# Patient Record
Sex: Female | Born: 1970 | Race: Black or African American | Hispanic: No | Marital: Single | Smoking: Current every day smoker
Health system: Southern US, Community
[De-identification: ages and names within clinical notes are randomized; demographics above are authoritative.]

## PROBLEM LIST (undated history)

## (undated) DIAGNOSIS — J449 Chronic obstructive pulmonary disease, unspecified: Secondary | ICD-10-CM

## (undated) DIAGNOSIS — I509 Heart failure, unspecified: Secondary | ICD-10-CM

## (undated) DIAGNOSIS — J45909 Unspecified asthma, uncomplicated: Secondary | ICD-10-CM

## (undated) DIAGNOSIS — M1990 Unspecified osteoarthritis, unspecified site: Secondary | ICD-10-CM

## (undated) DIAGNOSIS — K859 Acute pancreatitis without necrosis or infection, unspecified: Secondary | ICD-10-CM

## (undated) DIAGNOSIS — I10 Essential (primary) hypertension: Secondary | ICD-10-CM

## (undated) DIAGNOSIS — K5792 Diverticulitis of intestine, part unspecified, without perforation or abscess without bleeding: Secondary | ICD-10-CM

## (undated) HISTORY — PX: CARPAL TUNNEL RELEASE: SHX101

---

## 2020-08-22 ENCOUNTER — Other Ambulatory Visit: Payer: Self-pay

## 2020-08-22 ENCOUNTER — Emergency Department (HOSPITAL_COMMUNITY)
Admission: EM | Admit: 2020-08-22 | Discharge: 2020-08-22 | Disposition: A | Discharge status: Home / Self Care | Payer: 59 | Attending: Emergency Medicine | Admitting: Emergency Medicine

## 2020-08-22 ENCOUNTER — Encounter (HOSPITAL_COMMUNITY): Payer: Self-pay

## 2020-08-22 DIAGNOSIS — J449 Chronic obstructive pulmonary disease, unspecified: Secondary | ICD-10-CM | POA: Insufficient documentation

## 2020-08-22 DIAGNOSIS — L02411 Cutaneous abscess of right axilla: Secondary | ICD-10-CM | POA: Diagnosis not present

## 2020-08-22 DIAGNOSIS — J45909 Unspecified asthma, uncomplicated: Secondary | ICD-10-CM | POA: Diagnosis not present

## 2020-08-22 DIAGNOSIS — I509 Heart failure, unspecified: Secondary | ICD-10-CM | POA: Diagnosis not present

## 2020-08-22 DIAGNOSIS — I110 Hypertensive heart disease with heart failure: Secondary | ICD-10-CM | POA: Insufficient documentation

## 2020-08-22 DIAGNOSIS — R2231 Localized swelling, mass and lump, right upper limb: Secondary | ICD-10-CM | POA: Diagnosis present

## 2020-08-22 HISTORY — DX: Essential (primary) hypertension: I10

## 2020-08-22 HISTORY — DX: Heart failure, unspecified: I50.9

## 2020-08-22 HISTORY — DX: Chronic obstructive pulmonary disease, unspecified: J44.9

## 2020-08-22 HISTORY — DX: Acute pancreatitis without necrosis or infection, unspecified: K85.90

## 2020-08-22 HISTORY — DX: Diverticulitis of intestine, part unspecified, without perforation or abscess without bleeding: K57.92

## 2020-08-22 HISTORY — DX: Unspecified osteoarthritis, unspecified site: M19.90

## 2020-08-22 HISTORY — DX: Unspecified asthma, uncomplicated: J45.909

## 2020-08-22 MED ORDER — DOXYCYCLINE HYCLATE 100 MG PO CAPS: 100.0000 mg | ORAL_CAPSULE | Freq: Two times a day (BID) | ORAL | 0 refills | Status: DC

## 2020-08-22 NOTE — Discharge Instructions (Signed)
Please read and follow all provided instructions.  Your diagnoses today include:  1. Abscess of axilla, right     Tests performed today include:  Vital signs. See below for your results today.   Medications prescribed:   Doxycycline - antibiotic  You have been prescribed an antibiotic medicine: take the entire course of medicine even if you are feeling better. Stopping early can cause the antibiotic not to work.  Take any prescribed medications only as directed.   Home care instructions:   Follow any educational materials contained in this packet  Follow-up instructions: Return to the Emergency Department in 48 hours for a recheck if your symptoms are not significantly improved.  Please follow-up with your primary care provider in the next 1 week for further evaluation of your symptoms.   Return instructions:  Return to the Emergency Department if you have:  Fever  Worsening symptoms  Worsening pain  Worsening swelling  Redness of the skin that moves away from the affected area, especially if it streaks away from the affected area   Any other emergent concerns  Your vital signs today were: BP 140/86 (BP Location: Left Arm)   Pulse 75   Temp 97.9 F (36.6 C) (Oral)   Resp 16   SpO2 100%  If your blood pressure (BP) was elevated above 135/85 this visit, please have this repeated by your doctor within one month. --------------

## 2020-08-22 NOTE — ED Notes (Signed)
An After Visit Summary was printed and given to the patient. Discharge instructions given and no further questions at this time.  

## 2020-08-22 NOTE — ED Provider Notes (Signed)
[facility]-EMERGENCY DEPT Provider Note   CSN: [id number] Arrival date & time: 08/22/20  1351     History Chief Complaint  Patient presents with  . Abscess    Mariah Price is a 50 y.o. female.  Patient presents the emergency department today for evaluation of abscess to the right axilla.  Patient states that she has had this present over the past 2 months.  Is been draining recently.  She has been applying warm compresses running warm water over in the shower.  She states that she has had abscesses in the groin area before.  No nausea or vomiting.  No fevers.  The onset of this condition was acute. The course is constant. Aggravating factors: none. Alleviating factors: none.          Past Medical History:  Diagnosis Date  . Arthritis   . Asthma   . CHF (congestive heart failure) (HCC)   . COPD (chronic obstructive pulmonary disease) (HCC)   . Diverticulitis   . Hypertension   . Pancreatitis     There are no problems to display for this patient.   The histories are not reviewed yet. Please review them in the "History" navigator section and refresh this SmartLink.   OB History   No obstetric history on file.     No family history on file.     Home Medications Prior to Admission medications   Medication Sig Start Date End Date Taking? Authorizing Provider  doxycycline (VIBRAMYCIN) 100 MG capsule Take 1 capsule (100 mg total) by mouth 2 (two) times daily. 08/22/20  Yes Renne Crigler, PA-C    Allergies    Sulfa antibiotics  Review of Systems   Review of Systems  Constitutional: Negative for fever.  Gastrointestinal: Negative for nausea and vomiting.  Skin: Positive for wound. Negative for color change.       Positive for abscess.  Hematological: Negative for adenopathy.    Physical Exam Updated Vital Signs BP 140/86 (BP Location: Left Arm)   Pulse 75   Temp 97.9 F (36.6 C) (Oral)   Resp 16   SpO2 100%   Physical Exam Vitals  and nursing note reviewed.  Constitutional:      Appearance: She is well-developed.  HENT:     Head: Normocephalic and atraumatic.  Eyes:     Conjunctiva/sclera: Conjunctivae normal.  Pulmonary:     Effort: No respiratory distress.  Musculoskeletal:     Cervical back: Normal range of motion and neck supple.  Skin:    General: Skin is warm and dry.     Comments: There is a small abscess, < 1cm of the right axilla.  It is actively draining with any pressure applied.  No overlying erythema.  Neurological:     Mental Status: She is alert.     ED Results / Procedures / Treatments   Labs (all labs ordered are listed, but only abnormal results are displayed) Labs Reviewed - No data to display  EKG None  Radiology No results found.  Procedures Procedures   Medications Ordered in ED Medications - No data to display  ED Course  I have reviewed the triage vital signs and the nursing notes.  Pertinent labs & imaging results that were available during my care of the patient were reviewed by me and considered in my medical decision making (see chart for details).  Patient seen and examined.  Patient has a small actively draining abscess.  We discussed utility of incision and  drainage given small abscess, actively draining.  Patient declines I&D at this time.  Vital signs reviewed and are as follows: BP 140/86 (BP Location: Left Arm)   Pulse 75   Temp 97.9 F (36.6 C) (Oral)   Resp 16   SpO2 100%   The patient was urged to return to the Emergency Department urgently with worsening pain, swelling, expanding erythema especially if it streaks away from the affected area, fever, or if they have any other concerns.    MDM Rules/Calculators/A&P                          Patient with small right axillary abscess, actively draining.  Minimal swelling and induration, no fluctuance.  Do not feel that incision and drainage would be greatly beneficial at this time.  Patient agrees and  would prefer not to have I&D attempted.  Doxycycline as above.  Discussed return with worsening.   Final Clinical Impression(s) / ED Diagnoses Final diagnoses:  Abscess of axilla, right    Rx / DC Orders ED Discharge Orders         Ordered    doxycycline (VIBRAMYCIN) 100 MG capsule  2 times daily        08/22/20 1439           Renne Crigler, PA-C 08/22/20 1446    Gwyneth Sprout, MD 08/22/20 1535

## 2020-08-22 NOTE — ED Triage Notes (Signed)
Pt states she has an abscess under right arm, in armpit x2 months. Pt states she has been using warm compresses, states it has been draining. Pt states the abscess was draining today. Pt believes she needs antibiotics.

## 2020-09-14 ENCOUNTER — Other Ambulatory Visit: Payer: Self-pay

## 2020-09-14 ENCOUNTER — Encounter (HOSPITAL_COMMUNITY): Payer: Self-pay | Admitting: Emergency Medicine

## 2020-09-14 ENCOUNTER — Emergency Department (HOSPITAL_COMMUNITY)
Admission: EM | Admit: 2020-09-14 | Discharge: 2020-09-14 | Disposition: A | Discharge status: Home / Self Care | Payer: 59 | Attending: Emergency Medicine | Admitting: Emergency Medicine

## 2020-09-14 DIAGNOSIS — J45909 Unspecified asthma, uncomplicated: Secondary | ICD-10-CM | POA: Diagnosis not present

## 2020-09-14 DIAGNOSIS — J449 Chronic obstructive pulmonary disease, unspecified: Secondary | ICD-10-CM | POA: Insufficient documentation

## 2020-09-14 DIAGNOSIS — I509 Heart failure, unspecified: Secondary | ICD-10-CM | POA: Insufficient documentation

## 2020-09-14 DIAGNOSIS — I110 Hypertensive heart disease with heart failure: Secondary | ICD-10-CM | POA: Insufficient documentation

## 2020-09-14 DIAGNOSIS — M549 Dorsalgia, unspecified: Secondary | ICD-10-CM | POA: Diagnosis present

## 2020-09-14 DIAGNOSIS — R109 Unspecified abdominal pain: Secondary | ICD-10-CM | POA: Diagnosis not present

## 2020-09-14 MED ORDER — METHOCARBAMOL 750 MG PO TABS: 750.0000 mg | ORAL_TABLET | Freq: Four times a day (QID) | ORAL | 0 refills | Status: AC

## 2020-09-14 MED ORDER — LIDOCAINE 5 % EX PTCH: 1.0000 | MEDICATED_PATCH | CUTANEOUS | 0 refills | Status: DC

## 2020-09-14 MED ORDER — IBUPROFEN 400 MG PO TABS: 400.0000 mg | ORAL_TABLET | Freq: Four times a day (QID) | ORAL | 0 refills | Status: AC | PRN

## 2020-09-14 NOTE — ED Triage Notes (Signed)
Patient complains of R lower back and hip pain that shoots down her R leg since Monday. Took Advil w/o relief. Endorsees standing a lot at work, denies any falls or trauma before the pain started, states she feels like her leg is going to give out on her.

## 2020-09-14 NOTE — ED Provider Notes (Signed)
[facility]-EMERGENCY DEPT Provider Note   CSN: [id number] Arrival date & time: 09/14/20  7124     History Chief Complaint  Patient presents with  . right leg pain    Mariah Price is a 50 y.o. female.  50 year old female presents for right-sided flank pain x1 week.  Denies any urinary symptoms.  Pain is characterized as a pulling sensation across her back and worse when she stands up.  No bowel or bladder dysfunction.  Has used Tylenol with limited relief.  Pain is better with remaining still.  No rashes noted.        Past Medical History:  Diagnosis Date  . Arthritis   . Asthma   . CHF (congestive heart failure) (HCC)   . COPD (chronic obstructive pulmonary disease) (HCC)   . Diverticulitis   . Hypertension   . Pancreatitis     There are no problems to display for this patient.   History reviewed. No pertinent surgical history.   OB History   No obstetric history on file.     No family history on file.     Home Medications Prior to Admission medications   Medication Sig Start Date End Date Taking? Authorizing Provider  doxycycline (VIBRAMYCIN) 100 MG capsule Take 1 capsule (100 mg total) by mouth 2 (two) times daily. 08/22/20   Renne Crigler, PA-C    Allergies    Sulfa antibiotics  Review of Systems   Review of Systems  All other systems reviewed and are negative.   Physical Exam Updated Vital Signs BP (!) 156/101 (BP Location: Left Arm)   Pulse 80   Temp 98.1 F (36.7 C) (Oral)   Resp 18   Ht 1.575 m (5\' 2" )   Wt 95.3 kg   SpO2 99%   BMI 38.41 kg/m   Physical Exam Vitals and nursing note reviewed.  Constitutional:      General: She is not in acute distress.    Appearance: Normal appearance. She is well-developed. She is not toxic-appearing.  HENT:     Head: Normocephalic and atraumatic.  Eyes:     General: Lids are normal.     Conjunctiva/sclera: Conjunctivae normal.     Pupils: Pupils are equal, round, and  reactive to light.  Neck:     Thyroid: No thyroid mass.     Trachea: No tracheal deviation.  Cardiovascular:     Rate and Rhythm: Normal rate and regular rhythm.     Heart sounds: Normal heart sounds. No murmur heard. No gallop.   Pulmonary:     Effort: Pulmonary effort is normal. No respiratory distress.     Breath sounds: Normal breath sounds. No stridor. No decreased breath sounds, wheezing, rhonchi or rales.  Abdominal:     General: Bowel sounds are normal. There is no distension.     Palpations: Abdomen is soft.     Tenderness: There is no abdominal tenderness. There is no rebound.  Musculoskeletal:        General: No tenderness. Normal range of motion.     Cervical back: Normal range of motion and neck supple.       Back:     Comments: Neurovascular intact at right foot  Skin:    General: Skin is warm and dry.     Findings: No abrasion or rash.  Neurological:     Mental Status: She is alert and oriented to person, place, and time.     GCS: GCS eye subscore is 4.  GCS verbal subscore is 5. GCS motor subscore is 6.     Cranial Nerves: No cranial nerve deficit.     Sensory: No sensory deficit.  Psychiatric:        Speech: Speech normal.        Behavior: Behavior normal.     ED Results / Procedures / Treatments   Labs (all labs ordered are listed, but only abnormal results are displayed) Labs Reviewed - No data to display  EKG None  Radiology No results found.  Procedures Procedures   Medications Ordered in ED Medications - No data to display  ED Course  I have reviewed the triage vital signs and the nursing notes.  Pertinent labs & imaging results that were available during my care of the patient were reviewed by me and considered in my medical decision making (see chart for details).    MDM Rules/Calculators/A&P                          Patient with MSK back pain.  Placed on muscle relaxants, anti-inflammatories, lidocaine patch.  No red flags  noted Final Clinical Impression(s) / ED Diagnoses Final diagnoses:  None    Rx / DC Orders ED Discharge Orders    None       Lorre Nick, MD 09/14/20 1132

## 2020-09-14 NOTE — ED Notes (Signed)
An After Visit Summary was printed and given to the patient. Discharge instructions given and no further questions at this time.  

## 2020-09-18 ENCOUNTER — Other Ambulatory Visit: Payer: Self-pay

## 2020-09-18 ENCOUNTER — Emergency Department (HOSPITAL_COMMUNITY): Payer: 59

## 2020-09-18 ENCOUNTER — Emergency Department (HOSPITAL_COMMUNITY)
Admission: EM | Admit: 2020-09-18 | Discharge: 2020-09-18 | Disposition: A | Discharge status: Home / Self Care | Payer: 59 | Attending: Emergency Medicine | Admitting: Emergency Medicine

## 2020-09-18 ENCOUNTER — Encounter (HOSPITAL_COMMUNITY): Payer: Self-pay | Admitting: Emergency Medicine

## 2020-09-18 DIAGNOSIS — M549 Dorsalgia, unspecified: Secondary | ICD-10-CM | POA: Diagnosis not present

## 2020-09-18 DIAGNOSIS — M79604 Pain in right leg: Secondary | ICD-10-CM

## 2020-09-18 DIAGNOSIS — M791 Myalgia, unspecified site: Secondary | ICD-10-CM | POA: Insufficient documentation

## 2020-09-18 DIAGNOSIS — J45909 Unspecified asthma, uncomplicated: Secondary | ICD-10-CM | POA: Diagnosis not present

## 2020-09-18 DIAGNOSIS — I110 Hypertensive heart disease with heart failure: Secondary | ICD-10-CM | POA: Insufficient documentation

## 2020-09-18 DIAGNOSIS — M7918 Myalgia, other site: Secondary | ICD-10-CM

## 2020-09-18 DIAGNOSIS — M79651 Pain in right thigh: Secondary | ICD-10-CM | POA: Insufficient documentation

## 2020-09-18 DIAGNOSIS — M79661 Pain in right lower leg: Secondary | ICD-10-CM | POA: Diagnosis present

## 2020-09-18 DIAGNOSIS — M25551 Pain in right hip: Secondary | ICD-10-CM | POA: Insufficient documentation

## 2020-09-18 DIAGNOSIS — J449 Chronic obstructive pulmonary disease, unspecified: Secondary | ICD-10-CM | POA: Diagnosis not present

## 2020-09-18 DIAGNOSIS — I509 Heart failure, unspecified: Secondary | ICD-10-CM | POA: Diagnosis not present

## 2020-09-18 DIAGNOSIS — M79662 Pain in left lower leg: Secondary | ICD-10-CM | POA: Diagnosis not present

## 2020-09-18 DIAGNOSIS — F17200 Nicotine dependence, unspecified, uncomplicated: Secondary | ICD-10-CM | POA: Insufficient documentation

## 2020-09-18 MED ORDER — ACETAMINOPHEN-CODEINE #3 300-30 MG PO TABS: 1.0000 | ORAL_TABLET | Freq: Three times a day (TID) | ORAL | 0 refills | Status: DC | PRN

## 2020-09-18 MED ORDER — KETOROLAC TROMETHAMINE 60 MG/2ML IM SOLN
60.0000 mg | Freq: Once | INTRAMUSCULAR | Status: AC
  Administered 2020-09-18: 60 mg via INTRAMUSCULAR
  Filled 2020-09-18: qty 2

## 2020-09-18 MED ORDER — LOSARTAN POTASSIUM 50 MG PO TABS: 50.0000 mg | ORAL_TABLET | Freq: Every day | ORAL | 0 refills | Status: DC

## 2020-09-18 MED ORDER — HYDROCODONE-ACETAMINOPHEN 5-325 MG PO TABS
1.0000 | ORAL_TABLET | Freq: Once | ORAL | Status: AC
  Administered 2020-09-18: 1 via ORAL
  Filled 2020-09-18: qty 1

## 2020-09-18 MED ORDER — DEXAMETHASONE SODIUM PHOSPHATE 10 MG/ML IJ SOLN
10.0000 mg | Freq: Once | INTRAMUSCULAR | Status: AC
  Administered 2020-09-18: 10 mg via INTRAMUSCULAR
  Filled 2020-09-18: qty 1

## 2020-09-18 NOTE — ED Provider Notes (Signed)
[facility]   CSN: [id number] Arrival date & time: 09/18/20  0152     History Chief Complaint  Patient presents with  . Back Pain    Mariah Price is a 50 y.o. female with PMH/o CHF, COPD, HTN who presents for evaluation of back pain and right lower extremity pain that has been ongoing for the last week.  Patient was seen here in 09/14/20 for evaluation of similar symptoms.  Comes in today because she continues to have pain.  She states that the pain has improved in her back but she still has pain in her right upper leg.  She states that sometimes the pain in her back will send a sharp shooting pain down her leg.  Other times, she has a constant pain in the right hip and right leg that is worse when she tries to move.  She has been able to get up and ambulate on it but does report worsening pain with doing so.  She states particularly when she twists, bends or lifts her leg up, that makes the pain worse and she feels it in her hip and inner upper thigh.  There has been no overlying warmth, erythema.  She denies any fevers.  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  She denies any chest pain, abdominal pain, urinary complaints. Denies fevers, weight loss, numbness/weakness of upper and lower extremities, bowel/bladder incontinence, saddle anesthesia, history of back surgery, history of IVDA.   The history is provided by the patient.       Past Medical History:  Diagnosis Date  . Arthritis   . Asthma   . CHF (congestive heart failure) (HCC)   . COPD (chronic obstructive pulmonary disease) (HCC)   . Diverticulitis   . Hypertension   . Pancreatitis     There are no problems to display for this patient.   History reviewed. No pertinent surgical history.   OB History   No obstetric history on file.      No family history on file.  Social History   Tobacco Use  . Smoking status: Current Every Day Smoker  . Smokeless tobacco: Never Used  Substance Use Topics  . Alcohol use: Not Currently  . Drug use: Not Currently    Home Medications Prior to Admission medications   Medication Sig Start Date End Date Taking? Authorizing Provider  acetaminophen-codeine (TYLENOL #3) 300-30 MG tablet Take 1 tablet by mouth every 8 (eight) hours as needed for moderate pain. 09/18/20  Yes Maxwell Caul, PA-C  losartan (COZAAR) 50 MG tablet Take 1 tablet (50 mg total) by mouth daily. 09/18/20 10/18/20 Yes Maxwell Caul, PA-C  doxycycline (VIBRAMYCIN) 100 MG capsule Take 1 capsule (100 mg total) by mouth 2 (two) times daily. 08/22/20   Renne Crigler, PA-C  ibuprofen (ADVIL) 400 MG tablet Take 1 tablet (400 mg total) by mouth every 6 (six) hours as needed. 09/14/20   Lorre Nick, MD  lidocaine (LIDODERM) 5 % Place 1 patch onto the skin daily. Remove & Discard patch within 12 hours or as directed by MD 09/14/20   Lorre Nick, MD  methocarbamol (ROBAXIN-750) 750 MG tablet Take 1 tablet (750 mg total) by mouth 4 (four) times daily. 09/14/20   Lorre Nick, MD    Allergies    Sulfa  antibiotics  Review of Systems   Review of Systems  Gastrointestinal: Positive for nausea. Negative for abdominal pain and vomiting.  Musculoskeletal: Positive for back pain.       RLE pain  Neurological: Negative for weakness and numbness.  All other systems reviewed and are negative.   Physical Exam Updated Vital Signs BP (!) 155/92 (BP Location: Right Arm)   Pulse 78   Temp 97.9 F (36.6 C) (Oral)   Resp 18   SpO2 98%   Physical Exam Vitals and nursing note reviewed.  Constitutional:      Appearance: She is well-developed.  HENT:     Head: Normocephalic and atraumatic.  Eyes:     General: No scleral icterus.       Right eye: No discharge.        Left eye: No discharge.     Conjunctiva/sclera:  Conjunctivae normal.  Cardiovascular:     Pulses:          Dorsalis pedis pulses are 2+ on the right side and 2+ on the left side.  Pulmonary:     Effort: Pulmonary effort is normal.  Abdominal:     Comments: Abdomen is soft, non-distended, non-tender. No rigidity, No guarding. No peritoneal signs.  Musculoskeletal:     Comments: Tenderness noted to the right hip with no deformity or crepitus noted.  No overlying warmth, erythema, edema.  Diffuse muscular tenderness into the proximal right thigh.  Extension intact.  When she tries to lift the leg off the table, she has pain noted to the right hip.  Internal and external rotation intact but with some pain.  No bony tenderness noted to the right knee, right tib-fib.  No tenderness palpation of the left lower extremity.  Skin:    General: Skin is warm and dry.     Comments: RLE is without any overlying warmth, erythema.   Neurological:     Mental Status: She is alert.     Comments: Follows commands, Moves all extremities  5/5 strength to BLE  Sensation intact throughout all major nerve distributions  Psychiatric:        Speech: Speech normal.        Behavior: Behavior normal.     ED Results / Procedures / Treatments   Labs (all labs ordered are listed, but only abnormal results are displayed) Labs Reviewed - No data to display  EKG None  Radiology DG Lumbar Spine Complete  Result Date: 09/18/2020 CLINICAL DATA:  50 year old female with right low back pain radiating to the hip and down the leg for 1 week. EXAM: LUMBAR SPINE - COMPLETE 4+ VIEW COMPARISON:  None. FINDINGS: Normal lumbar segmentation. Mild straightening of lumbar lordosis. No spondylolisthesis. No significant scoliosis. No pars fracture. No acute osseous abnormality identified. Visible sacrum and SI joints appear intact. Relatively preserved disc spaces. Mild to moderate degenerative endplate spurring at L3-L4. Mild endplate spurring at L2-L3. Mild Calcified aortic  atherosclerosis. Negative visible bowel gas pattern. IMPRESSION: 1. No acute osseous abnormality identified in the lumbar spine. 2. Disc spaces relatively preserved. Degenerative endplate spurring at L3-L4 and L2-L3. Electronically Signed   By: Odessa Fleming M.D.   On: 09/18/2020 04:34   DG Hip Unilat W or Wo Pelvis 2-3 Views Right  Result Date: 09/18/2020 CLINICAL DATA:  50 year old female with right low back pain radiating to the hip and down the leg for 1 week. EXAM: DG HIP (WITH OR WITHOUT PELVIS) 2-3V RIGHT COMPARISON:  Lumbar radiographs today. FINDINGS:  Bone mineralization is within normal limits. Hip joint spaces appear symmetric and within normal limits. SI joints appear symmetric and normal. Grossly intact proximal left femur. Proximal right femur appears normal. No acute osseous abnormality identified. Negative visible lower abdominal and pelvic visceral contours. IMPRESSION: Negative. Electronically Signed   By: Odessa Fleming M.D.   On: 09/18/2020 04:35   DG Femur Min 2 Views Right  Result Date: 09/18/2020 CLINICAL DATA:  50 year old female with right low back pain radiating to the hip and down the leg for 1 week. EXAM: RIGHT FEMUR 2 VIEWS COMPARISON:  Right hip and pelvis series today. FINDINGS: Bone mineralization is within normal limits. Normal alignment at the right hip and knee. No knee joint effusion is evident. There is no evidence of fracture or other focal bone lesions. Soft tissue contours are unremarkable. IMPRESSION: Negative. Electronically Signed   By: Odessa Fleming M.D.   On: 09/18/2020 04:36    Procedures Procedures   Medications Ordered in ED Medications  dexamethasone (DECADRON) injection 10 mg (10 mg Intramuscular Given 09/18/20 0342)  HYDROcodone-acetaminophen (NORCO/VICODIN) 5-325 MG per tablet 1 tablet (1 tablet Oral Given 09/18/20 0340)  ketorolac (TORADOL) injection 60 mg (60 mg Intramuscular Given 09/18/20 0341)    ED Course  I have reviewed the triage vital signs and the  nursing notes.  Pertinent labs & imaging results that were available during my care of the patient were reviewed by me and considered in my medical decision making (see chart for details).    MDM Rules/Calculators/A&P                          50 y.o. F who presents for evaluation of back pain and RLE pain that has been ongoing for a week.  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  No numbness/weakness.  No abdominal pain.  No urinary complaints.  On initial arrival, she is afebrile nontoxic-appearing.  Vital signs are stable.  On exam, she has tenderness noted to the right hip and right thigh.  This pain is worse with movement of her right lower extremity.  No overlying warmth, erythema, edema.  She has good distal pulses.  History/physical exam is not concerning for septic arthritis, ischemic limb.  This seems to be musculoskeletal in nature.  Pain is reproduced with range of motion of the right lower extremity.  Additionally, we discussed there could be sciatic component given that she has had some pain shooting down her left lower leg.  She was seen here on 4/21.  Given that it has repeat visit, will obtain imaging to ensure no acute bony abnormality.  History/physical exam not concerning for cauda equina, spinal abscess, aortic dissection.  No indication for acute emergent imaging.  X-ray shows no acute bony abnormality.  She does have some degenerative endplate spurring at L3-L4 and L2-L3.  Hip x-ray negative.  Femur x-ray negative.  Discussed results with patient.  She does report improvement in pain after analgesics.  I discussed with her that this could be musculoskeletal versus sciatica.  We discussed at length that this could take some time to get better.  She states she has not had much improvement in pain after Robaxin that she was previously prescribed.  We will plan to give short course of pain medication.  She is also asking for refills of her losartan.   Patient just moved to [place] area and has not  established with a primary care doctor yet. Review of her records show that she has received Losartan 100 mg. Will refill her prescription. She has been on wellbutrin in the past.  Unclear if she is actually taking this.  Will not represcribe Wellbutrin.  Given that she is on losartan, will hold off on prescribing meloxicam, tramadol.  Additionally, given history of Wellbutrin with uncertain if she is on it, will hold off on any tramadol.  Given patient's breakthrough pain, she is given a short course of Tylenol 3. At this time, patient exhibits no emergent life-threatening condition that require further evaluation in ED. Patient had ample opportunity for questions and discussion. All patient's questions were answered with full understanding. Strict return precautions discussed. Patient expresses understanding and agreement to plan.   Portions of this note were generated with Scientist, clinical (histocompatibility and immunogenetics)Dragon dictation software. Dictation errors may occur despite best attempts at proofreading.   Final Clinical Impression(s) / ED Diagnoses Final diagnoses:  Right leg pain  Musculoskeletal pain    Rx / DC Orders ED Discharge Orders         Ordered    acetaminophen-codeine (TYLENOL #3) 300-30 MG tablet  Every 8 hours PRN        09/18/20 0554    losartan (COZAAR) 50 MG tablet  Daily        09/18/20 0554           Maxwell CaulLayden, Jemal Miskell A, PA-C 09/18/20 0612    Palumbo, April, MD 09/18/20 [id number]

## 2020-09-18 NOTE — Discharge Instructions (Addendum)
You can take ibuprofen over the counted.   Take tylenol 3 as directed for break through pain. Do not drive or operate machinery while taking this medication. This may make you sleepy.   I provided you a short course of your losartan.  You will need to follow-up with a primary care doctor and establish with them to get this refilled.  Return to the Emergency Department immediately for any worsening back pain, neck pain, difficulty walking, numbness/weaknss of your arms or legs, urinary or bowel accidents, fever or any other worsening or concerning symptoms.

## 2020-09-18 NOTE — ED Triage Notes (Signed)
Patient arrives complaining of the same back pain she was evaluated for previously. Patient states interventions have been unsuccessful

## 2020-09-29 ENCOUNTER — Encounter (HOSPITAL_COMMUNITY): Payer: Self-pay

## 2020-09-29 ENCOUNTER — Other Ambulatory Visit: Payer: Self-pay

## 2020-09-29 ENCOUNTER — Emergency Department (HOSPITAL_COMMUNITY)
Admission: EM | Admit: 2020-09-29 | Discharge: 2020-09-29 | Disposition: A | Discharge status: Home / Self Care | Payer: 59 | Attending: Emergency Medicine | Admitting: Emergency Medicine

## 2020-09-29 DIAGNOSIS — F17200 Nicotine dependence, unspecified, uncomplicated: Secondary | ICD-10-CM | POA: Insufficient documentation

## 2020-09-29 DIAGNOSIS — Z23 Encounter for immunization: Secondary | ICD-10-CM | POA: Diagnosis not present

## 2020-09-29 DIAGNOSIS — I110 Hypertensive heart disease with heart failure: Secondary | ICD-10-CM | POA: Diagnosis not present

## 2020-09-29 DIAGNOSIS — L02411 Cutaneous abscess of right axilla: Secondary | ICD-10-CM | POA: Diagnosis present

## 2020-09-29 DIAGNOSIS — Z79899 Other long term (current) drug therapy: Secondary | ICD-10-CM | POA: Insufficient documentation

## 2020-09-29 DIAGNOSIS — J449 Chronic obstructive pulmonary disease, unspecified: Secondary | ICD-10-CM | POA: Insufficient documentation

## 2020-09-29 DIAGNOSIS — J45909 Unspecified asthma, uncomplicated: Secondary | ICD-10-CM | POA: Diagnosis not present

## 2020-09-29 DIAGNOSIS — Z20822 Contact with and (suspected) exposure to covid-19: Secondary | ICD-10-CM | POA: Diagnosis not present

## 2020-09-29 DIAGNOSIS — I509 Heart failure, unspecified: Secondary | ICD-10-CM | POA: Diagnosis not present

## 2020-09-29 MED ORDER — TETANUS-DIPHTH-ACELL PERTUSSIS 5-2.5-18.5 LF-MCG/0.5 IM SUSY
0.5000 mL | PREFILLED_SYRINGE | Freq: Once | INTRAMUSCULAR | Status: AC
  Administered 2020-09-29: 0.5 mL via INTRAMUSCULAR
  Filled 2020-09-29: qty 0.5

## 2020-09-29 MED ORDER — LIDOCAINE-EPINEPHRINE 2 %-1:100000 IJ SOLN
20.0000 mL | Freq: Once | INTRAMUSCULAR | Status: AC
  Administered 2020-09-29: 20 mL
  Filled 2020-09-29: qty 1

## 2020-09-29 MED ORDER — DOXYCYCLINE HYCLATE 100 MG PO CAPS: 100.0000 mg | ORAL_CAPSULE | Freq: Two times a day (BID) | ORAL | 0 refills | Status: DC

## 2020-09-29 NOTE — ED Provider Notes (Signed)
Emergency Medicine Provider Triage Evaluation Note  Mariah Price , a 50 y.o. female  was evaluated in triage.  Pt complains of abscess.  Review of Systems  Positive: R axillary abscess Negative: Fever, cp  Physical Exam  BP (!) 148/102 (BP Location: Left Arm)   Pulse 93   Temp 99 F (37.2 C) (Oral)   Resp 16   Ht 5\' 2"  (1.575 m)   Wt 93 kg   SpO2 97%   BMI 37.49 kg/m  Gen:   Awake, tearful Resp:  Normal effort  MSK:   Moves extremities without difficulty  Other:  R axillary fold: area of induration/fluctuance oozing purulent discharge.  Surrounding skin erythema and warmth with exquisite tenderness.   Medical Decision Making  Medically screening exam initiated at 4:17 PM.  Appropriate orders placed.  Kamden Yost was informed that the remainder of the evaluation will be completed by another provider, this initial triage assessment does not replace that evaluation, and the importance of remaining in the ED until their evaluation is complete.  R axillary abscess x 1 month, not improve with abx.  Actively draining. Also report covid exposure.    Elita Boone, PA-C 09/29/20 1620    11/29/20, MD 09/30/20 0001

## 2020-09-29 NOTE — ED Provider Notes (Signed)
[facility]-EMERGENCY DEPT Provider Note   CSN: [id number] Arrival date & time: 09/29/20  1508     History Chief Complaint  Patient presents with  . Abscess    Mariah Price is a 50 y.o. female presents to ER for evaluation of recurrent boil under right axilla for the last 1 month.  Reports long history of recurrent boils in axillae, inguinal folds. She has had several incision and drainage procedures, packing, antibiotics. She has seen a dermatologist. States they prescribe her medicines that don't work because the boils keep coming back.  She last finished antibiotics 2 weeks ago for this boil.  Symptoms have gradually returned including redness, warmth, exquisite tenderness. Denies fevers, chills.  History of prediabetes and tobacco use.   HPI     Past Medical History:  Diagnosis Date  . Arthritis   . Asthma   . CHF (congestive heart failure) (HCC)   . COPD (chronic obstructive pulmonary disease) (HCC)   . Diverticulitis   . Hypertension   . Pancreatitis     There are no problems to display for this patient.   History reviewed. No pertinent surgical history.   OB History   No obstetric history on file.     Family History  Problem Relation Age of Onset  . Multiple sclerosis Mother   . Heart failure Father     Social History   Tobacco Use  . Smoking status: Current Every Day Smoker  . Smokeless tobacco: Never Used  Vaping Use  . Vaping Use: Never used  Substance Use Topics  . Alcohol use: Not Currently  . Drug use: Not Currently    Home Medications Prior to Admission medications   Medication Sig Start Date End Date Taking? Authorizing Provider  doxycycline (VIBRAMYCIN) 100 MG capsule Take 1 capsule (100 mg total) by mouth 2 (two) times daily. 09/29/20  Yes Sharen Heck J, PA-C  acetaminophen-codeine (TYLENOL #3) 300-30 MG tablet Take 1 tablet by mouth every 8 (eight) hours as needed for moderate pain. 09/18/20   Maxwell Caul, PA-C   ibuprofen (ADVIL) 400 MG tablet Take 1 tablet (400 mg total) by mouth every 6 (six) hours as needed. 09/14/20   Lorre Nick, MD  lidocaine (LIDODERM) 5 % Place 1 patch onto the skin daily. Remove & Discard patch within 12 hours or as directed by MD 09/14/20   Lorre Nick, MD  losartan (COZAAR) 50 MG tablet Take 1 tablet (50 mg total) by mouth daily. 09/18/20 10/18/20  Maxwell Caul, PA-C  methocarbamol (ROBAXIN-750) 750 MG tablet Take 1 tablet (750 mg total) by mouth 4 (four) times daily. 09/14/20   Lorre Nick, MD    Allergies    Sulfa antibiotics  Review of Systems   Review of Systems  Skin:       Boil   All other systems reviewed and are negative.   Physical Exam Updated Vital Signs BP (!) 148/102 (BP Location: Left Arm)   Pulse 93   Temp 99 F (37.2 C) (Oral)   Resp 16   Ht 5\' 2"  (1.575 m)   Wt 93 kg   SpO2 97%   BMI 37.49 kg/m   Physical Exam Constitutional:      Appearance: She is well-developed.  HENT:     Head: Normocephalic.     Nose: Nose normal.  Eyes:     General: Lids are normal.  Cardiovascular:     Rate and Rhythm: Normal rate.  Pulmonary:  Effort: Pulmonary effort is normal. No respiratory distress.  Musculoskeletal:        General: Normal range of motion.     Cervical back: Normal range of motion.  Skin:    Comments: Right axilla: two congruent fluctuant areas measuring together 10 cm x 3 cm, one is actively draining thick yellow purulence. Malodorous. Exquisite tenderness, mild erythema. Previous linear scarring and pits/scarring noted. No extensive erythema outwardly   Neurological:     Mental Status: She is alert.  Psychiatric:        Behavior: Behavior normal.     ED Results / Procedures / Treatments   Labs (all labs ordered are listed, but only abnormal results are displayed) Labs Reviewed  SARS CORONAVIRUS 2 (TAT 6-24 HRS)    EKG None  Radiology No results found.  Procedures .Marland KitchenIncision and Drainage  Date/Time:  09/29/2020 6:35 PM Performed by: Liberty Handy, PA-C Authorized by: Liberty Handy, PA-C   Consent:    Consent obtained:  Verbal   Consent given by:  Patient   Risks discussed:  Bleeding, incomplete drainage, pain and damage to other organs   Alternatives discussed:  No treatment Universal protocol:    Procedure explained and questions answered to patient or proxy's satisfaction: yes     Relevant documents present and verified: yes     Test results available : yes     Imaging studies available: yes     Required blood products, implants, devices, and special equipment available: yes     Site/side marked: yes     Immediately prior to procedure, a time out was called: yes     Patient identity confirmed:  Verbally with patient Location:    Type:  Abscess   Size:  5 x 3   Location:  Upper extremity   Upper extremity location: right axilla. Pre-procedure details:    Procedure prep: alcohol swab. Anesthesia:    Anesthesia method:  Local infiltration   Local anesthetic:  Lidocaine 2% WITH epi Procedure type:    Complexity:  Simple Procedure details:    Incision types:  Single straight   Incision depth:  Subcutaneous   Wound management:  Probed and deloculated, irrigated with saline and extensive cleaning   Drainage:  Purulent   Drainage amount:  Copious   Packing materials:  1/4 in gauze Post-procedure details:    Procedure completion:  Tolerated with difficulty     Medications Ordered in ED Medications  Tdap (BOOSTRIX) injection 0.5 mL (0.5 mLs Intramuscular Given 09/29/20 1735)  lidocaine-EPINEPHrine (XYLOCAINE W/EPI) 2 %-1:100000 (with pres) injection 20 mL (20 mLs Infiltration Given 09/29/20 1735)    ED Course  I have reviewed the triage vital signs and the nursing notes.  Pertinent labs & imaging results that were available during my care of the patient were reviewed by me and considered in my medical decision making (see chart for details).    MDM  Rules/Calculators/A&P                          50 yo F here for recurrent right axillary abscesses x 2.  Describes recurrent abscess in creases including axillae, inguinal creases.  Suspect hidradenitis suppurativa.  She is not on suppressive antibiotics.  Denies systemic symptoms. Afebrile here. I don't think we need emergent labs or imaging. Bedside I&D performed here of only one abscess. Patient was extremely nervous, screamed very loudly during I&D. I attempted to convince her to allow me  to drain the second abscess but she couldn't tolerate it and declined. She understands that this puts her at risk for worsening abscess and states it is fine that she will come back if she needs to.  Packing in place. Will discharge with antibiotics, warm compresses, NSAIDs. States she has appointment with dermatology locally next week. Return precautions given.   Final Clinical Impression(s) / ED Diagnoses Final diagnoses:  Abscess of axilla, right    Rx / DC Orders ED Discharge Orders         Ordered    doxycycline (VIBRAMYCIN) 100 MG capsule  2 times daily        09/29/20 1835           Liberty Handy, PA-C 09/29/20 1836    Pollyann Savoy, MD 09/29/20 2147

## 2020-09-29 NOTE — Discharge Instructions (Addendum)
You have two abscesses (boils).  I drained only one, you declined drainage of the second one  Remove packing in 48 hours  Start doxycycline   Warm compresses/massage  Ibuprofen/acetaminophen for pain  Establish care with dermatologist   Return for worsening or recurrent symptoms, fever greater than 100.23F

## 2020-09-29 NOTE — ED Triage Notes (Addendum)
Patient c/o right axillary abscess x 1 month. Patient states she has had antibiotics for the same previously, but abscess has returned.  Patient states her significant other is Covid + and they share the same household. patient states her SO has been in quarantine x 3 days.

## 2020-09-30 LAB — SARS CORONAVIRUS 2 (TAT 6-24 HRS): SARS Coronavirus 2: NEGATIVE

## 2020-10-24 ENCOUNTER — Ambulatory Visit: Payer: 59 | Admitting: Family Medicine

## 2021-02-19 ENCOUNTER — Encounter (HOSPITAL_COMMUNITY): Payer: Self-pay

## 2021-02-19 ENCOUNTER — Other Ambulatory Visit: Payer: Self-pay

## 2021-02-19 ENCOUNTER — Emergency Department (HOSPITAL_COMMUNITY)
Admission: EM | Admit: 2021-02-19 | Discharge: 2021-02-19 | Disposition: A | Discharge status: Home / Self Care | Payer: 59 | Attending: Emergency Medicine | Admitting: Emergency Medicine

## 2021-02-19 DIAGNOSIS — I509 Heart failure, unspecified: Secondary | ICD-10-CM | POA: Insufficient documentation

## 2021-02-19 DIAGNOSIS — F17200 Nicotine dependence, unspecified, uncomplicated: Secondary | ICD-10-CM | POA: Diagnosis not present

## 2021-02-19 DIAGNOSIS — M545 Low back pain, unspecified: Secondary | ICD-10-CM | POA: Insufficient documentation

## 2021-02-19 DIAGNOSIS — J449 Chronic obstructive pulmonary disease, unspecified: Secondary | ICD-10-CM | POA: Insufficient documentation

## 2021-02-19 DIAGNOSIS — J45909 Unspecified asthma, uncomplicated: Secondary | ICD-10-CM | POA: Insufficient documentation

## 2021-02-19 DIAGNOSIS — Z79899 Other long term (current) drug therapy: Secondary | ICD-10-CM | POA: Diagnosis not present

## 2021-02-19 DIAGNOSIS — I110 Hypertensive heart disease with heart failure: Secondary | ICD-10-CM | POA: Insufficient documentation

## 2021-02-19 DIAGNOSIS — M5410 Radiculopathy, site unspecified: Secondary | ICD-10-CM

## 2021-02-19 MED ORDER — HYDROMORPHONE HCL 2 MG/ML IJ SOLN
2.0000 mg | Freq: Once | INTRAMUSCULAR | Status: AC
  Administered 2021-02-19: 2 mg via INTRAMUSCULAR
  Filled 2021-02-19: qty 1

## 2021-02-19 MED ORDER — PREDNISONE 10 MG PO TABS: 20.0000 mg | ORAL_TABLET | Freq: Two times a day (BID) | ORAL | 0 refills | Status: DC

## 2021-02-19 MED ORDER — PREDNISONE 20 MG PO TABS
60.0000 mg | ORAL_TABLET | Freq: Once | ORAL | Status: AC
  Administered 2021-02-19: 60 mg via ORAL
  Filled 2021-02-19: qty 3

## 2021-02-19 MED ORDER — HYDROCODONE-ACETAMINOPHEN 5-325 MG PO TABS: 1.0000 | ORAL_TABLET | Freq: Four times a day (QID) | ORAL | 0 refills | Status: DC | PRN

## 2021-02-19 NOTE — ED Provider Notes (Signed)
[facility]-EMERGENCY DEPT Provider Note   CSN: [id number] Arrival date & time: 02/19/21  0247     History Chief Complaint  Patient presents with   Back Pain   leg pain    Mariah Price is a 50 y.o. female.  Patient is a 50 year old female with past medical history of CHF, COPD, hypertension.  Patient presenting today for evaluation of radicular low back pain.  She describes pain in her right lower back radiating into her leg.  She states that this pain has been present for the past 2 months.  She has seen her doctor for it and prescribed anti-inflammatory medications and muscle relaxers, however nothing seems to be helping.  She describes pain that radiates down the back of her thigh, but no weakness or bowel or bladder complaints.  Pain is worse when she ambulates and there are no alleviating factors.  The history is provided by the patient.  Back Pain Location:  Lumbar spine Quality:  Stabbing Radiates to:  R posterior upper leg Pain severity:  Severe Onset quality:  Gradual Duration:  2 months Timing:  Constant Progression:  Worsening Relieved by:  Nothing     Past Medical History:  Diagnosis Date   Arthritis    Asthma    CHF (congestive heart failure) (HCC)    COPD (chronic obstructive pulmonary disease) (HCC)    Diverticulitis    Hypertension    Pancreatitis     There are no problems to display for this patient.   History reviewed. No pertinent surgical history.   OB History   No obstetric history on file.     Family History  Problem Relation Age of Onset   Multiple sclerosis Mother    Heart failure Father     Social History   Tobacco Use   Smoking status: Every Day   Smokeless tobacco: Never  Vaping Use   Vaping Use: Never used  Substance Use Topics   Alcohol use: Not Currently   Drug use: Not Currently    Home Medications Prior to Admission medications   Medication Sig Start Date End Date Taking? Authorizing Provider   acetaminophen-codeine (TYLENOL #3) 300-30 MG tablet Take 1 tablet by mouth every 8 (eight) hours as needed for moderate pain. 09/18/20   Maxwell Caul, PA-C  doxycycline (VIBRAMYCIN) 100 MG capsule Take 1 capsule (100 mg total) by mouth 2 (two) times daily. 09/29/20   Liberty Handy, PA-C  ibuprofen (ADVIL) 400 MG tablet Take 1 tablet (400 mg total) by mouth every 6 (six) hours as needed. 09/14/20   Lorre Nick, MD  lidocaine (LIDODERM) 5 % Place 1 patch onto the skin daily. Remove & Discard patch within 12 hours or as directed by MD 09/14/20   Lorre Nick, MD  losartan (COZAAR) 50 MG tablet Take 1 tablet (50 mg total) by mouth daily. 09/18/20 10/18/20  Maxwell Caul, PA-C  methocarbamol (ROBAXIN-750) 750 MG tablet Take 1 tablet (750 mg total) by mouth 4 (four) times daily. 09/14/20   Lorre Nick, MD    Allergies    Sulfa antibiotics  Review of Systems   Review of Systems  Musculoskeletal:  Positive for back pain.  All other systems reviewed and are negative.  Physical Exam Updated Vital Signs BP (!) 154/98 (BP Location: Left Arm)   Pulse 83   Temp 98 F (36.7 C)   Resp 16   SpO2 95%   Physical Exam Vitals and nursing note reviewed.  Constitutional:  General: She is not in acute distress.    Appearance: Normal appearance. She is not ill-appearing.  HENT:     Head: Normocephalic and atraumatic.  Pulmonary:     Effort: Pulmonary effort is normal.  Musculoskeletal:     Comments: There is tenderness to palpation in the soft tissues of the right lower lumbar region.  Neurological:     Mental Status: She is alert and oriented to person, place, and time.     Comments: DTRs are 1+ and symmetrical in the patellar and Achilles tendons bilaterally.  Strength is 5 out of 5 in both lower extremities.  She is able to ambulate on heels and toes, but with some discomfort.    ED Results / Procedures / Treatments   Labs (all labs ordered are listed, but only abnormal  results are displayed) Labs Reviewed - No data to display  EKG None  Radiology No results found.  Procedures Procedures   Medications Ordered in ED Medications  HYDROmorphone (DILAUDID) injection 2 mg (has no administration in time range)  predniSONE (DELTASONE) tablet 60 mg (has no administration in time range)    ED Course  I have reviewed the triage vital signs and the nursing notes.  Pertinent labs & imaging results that were available during my care of the patient were reviewed by me and considered in my medical decision making (see chart for details).    MDM Rules/Calculators/A&P  Patient presenting here with radicular low back pain that has been present for 2 months.  She has seen her primary doctor for this, however none of the treatment seems to be helping.  She is neurologically intact with no bowel or bladder complaints or other red flags that would suggest an emergent situation.  I feel as though she is appropriate for outpatient follow-up.  Patient may require MRI or physical therapy if not improving with steroids and pain medication prescribed today.  Final Clinical Impression(s) / ED Diagnoses Final diagnoses:  None    Rx / DC Orders ED Discharge Orders     None        Geoffery Lyons, MD 02/19/21 0530

## 2021-02-19 NOTE — ED Triage Notes (Signed)
Pt complains of right leg pain that radiates across her lower abdomen and into her back x 1.5 weeks.

## 2021-02-19 NOTE — Discharge Instructions (Addendum)
Begin taking prednisone as prescribed.  Begin taking hydrocodone as prescribed as needed for pain.  Follow-up with your primary doctor if not improving in the next 1 to 2 weeks to discuss the possibility of physical therapy or imaging studies.

## 2021-08-21 ENCOUNTER — Ambulatory Visit: Payer: 59 | Admitting: Cardiology

## 2021-08-24 ENCOUNTER — Encounter: Payer: Self-pay | Admitting: Cardiology

## 2021-08-24 ENCOUNTER — Ambulatory Visit: Payer: 59 | Admitting: Cardiology

## 2021-08-24 VITALS — BP 166/90 | HR 84 | Temp 97.9°F | Resp 16 | Ht 62.0 in | Wt 213.6 lb

## 2021-08-24 DIAGNOSIS — E782 Mixed hyperlipidemia: Secondary | ICD-10-CM

## 2021-08-24 DIAGNOSIS — F1411 Cocaine abuse, in remission: Secondary | ICD-10-CM

## 2021-08-24 DIAGNOSIS — Z8249 Family history of ischemic heart disease and other diseases of the circulatory system: Secondary | ICD-10-CM

## 2021-08-24 DIAGNOSIS — R072 Precordial pain: Secondary | ICD-10-CM

## 2021-08-24 DIAGNOSIS — I10 Essential (primary) hypertension: Secondary | ICD-10-CM

## 2021-08-24 DIAGNOSIS — F17210 Nicotine dependence, cigarettes, uncomplicated: Secondary | ICD-10-CM

## 2021-08-24 DIAGNOSIS — J449 Chronic obstructive pulmonary disease, unspecified: Secondary | ICD-10-CM

## 2021-08-24 NOTE — Progress Notes (Signed)
? ?Date:  08/24/2021  ? ?ID:  Mariah FurlMamie Price, DOB 1970-07-03, MRN [id number] ? ?PCP:  [name], [name], MD  ?Cardiologist:  Tessa LernerSunit Jadda Hunsucker, DO, [facility] (established care 08/24/2021) ?Former Cardiology Providers: Dr. Cecilie KicksFrank Valeri ([facility] 06/2017) ? ?REASON FOR CONSULT: Family history of heart disease.  ? ?REQUESTING PHYSICIAN:  ?[name], [name], MD ?[address] ?[place] [id number] ? ?Chief Complaint  ?Patient presents with  ? New Patient (Initial Visit)  ?  Family history of heart disease.  ?Referred by Dow AdolphKimberly Tatum, NP  ? ? ?HPI  ?Mariah Price is a 51 y.o. African-American female who presents to the office with a chief complaint of " establish care." Patient's past medical history and cardiovascular risk factors include: Hypertension, hyperlipidemia, hx of cocaine use, prediabetes, cigarette smoking (0.5ppd), anxiety/depression, GERD, COPD. ? ?She is referred to the office at the request of [name], [name], MD for evaluation of family history of heart disease. ? ?[place] and establish care with PCP.  Given her family history of heart disease and her risk factors she is referred to cardiology for further evaluation and management. ? ?Review of electronic medical records notes that in 2019 she was seen by [facility] Otil[facility].  At that time patient had undergone a stress echocardiogram based on his office note.  Since then patient has been lost to follow-up until now. ? ?Patient describes left-sided chest pain, present for the last several years, underneath her left breast, occurs once or twice a week, either last for several hours or the whole day, self-limited.  The intensity is 5 out of 10, sharp-like sensation, nonexertional, does not resolve with rest.  Patient has not had a mammogram for the last 4 years or so.  Associated symptoms include shortness of breath which she is attributing to her underlying COPD  and active cigarette smoking. ? ?Family history: ?Patient has a total of 3 biological siblings including her.  Her oldest sister died at the age of 52 after being treated for congestive heart failure for 2 to 3 years.  Herself and her brother are alive and overall doing well.  ? ?Patient states that she had a nephew who had approximately 5 kids.  Her nephew died at the age of 52 and prior to that was on LVAD for 4 years.  One of her nephews son died at age of 52 from congestive heart failure.  ? ?FUNCTIONAL STATUS: ?No structured exercise program or daily routine.  ? ?ALLERGIES: ?Allergies  ?Allergen Reactions  ? Sulfa Antibiotics   ? ? ?MEDICATION LIST PRIOR TO VISIT: ?Current Meds  ?Medication Sig  ? budesonide-formoterol (SYMBICORT) 160-4.5 MCG/ACT inhaler Inhale into the lungs.  ? buPROPion (WELLBUTRIN XL) 150 MG 24 hr tablet Take 1 tablet by mouth daily.  ? doxycycline (VIBRAMYCIN) 100 MG capsule Take 1 capsule (100 mg total) by mouth 2 (two) times daily.  ? escitalopram (LEXAPRO) 10 MG tablet Take 10 mg by mouth daily.  ? famotidine (PEPCID) 10 MG tablet Take 1 tablet by mouth as needed.  ? hydrochlorothiazide (HYDRODIURIL) 12.5 MG tablet Take 12.5 mg by mouth every morning.  ? hydrOXYzine (ATARAX) 25 MG tablet Take 25 mg by mouth 4 (four) times daily.  ? ibuprofen (ADVIL) 400 MG tablet Take 1 tablet (400 mg total) by mouth every 6 (six) hours as needed.  ? ipratropium (ATROVENT HFA) 17 MCG/ACT inhaler Inhale into the lungs.  ?  lidocaine (LIDODERM) 5 % Place 1 patch onto the skin daily. Remove & Discard patch within 12 hours or as directed by MD  ? linaclotide (LINZESS) 290 MCG CAPS capsule Take 1 capsule by mouth as needed.  ? losartan (COZAAR) 100 MG tablet Take 100 mg by mouth daily.  ? metFORMIN (GLUCOPHAGE) 500 MG tablet Take 500 mg by mouth 2 (two) times daily.  ? methocarbamol (ROBAXIN-750) 750 MG tablet Take 1 tablet (750 mg total) by mouth 4 (four) times daily.  ? spironolactone (ALDACTONE) 25 MG  tablet Take 25 mg by mouth every other day.  ? [DISCONTINUED] losartan (COZAAR) 50 MG tablet Take 1 tablet (50 mg total) by mouth daily.  ?  ? ?PAST MEDICAL HISTORY: ?Past Medical History:  ?Diagnosis Date  ? Arthritis   ? Asthma   ? CHF (congestive heart failure) (HCC)   ? COPD (chronic obstructive pulmonary disease) (HCC)   ? Diverticulitis   ? Hypertension   ? Pancreatitis   ? ? ?PAST SURGICAL HISTORY: ?History reviewed. No pertinent surgical history. ? ?FAMILY HISTORY: ?The patient family history includes Heart attack in her nephew, nephew, and sister; Heart failure in her father; Multiple sclerosis in her mother. ? ?SOCIAL HISTORY:  ?The patient  reports that she has been smoking cigarettes. She has a 10.00 pack-year smoking history. She has never used smokeless tobacco. She reports that she does not currently use alcohol. She reports that she does not currently use drugs. ? ?REVIEW OF SYSTEMS: ?Review of Systems  ?Cardiovascular:  Positive for chest pain (see HPI). Negative for dyspnea on exertion, leg swelling, orthopnea, palpitations, paroxysmal nocturnal dyspnea and syncope.  ?Respiratory:  Positive for shortness of breath (chronic and stable).   ? ?PHYSICAL EXAM: ? ?  08/24/2021  ? 11:00 AM 08/24/2021  ? 10:59 AM 02/19/2021  ?  5:48 AM  ?Vitals with BMI  ?Height  5\' 2"    ?Weight  213 lbs 10 oz   ?BMI  39.06   ?Systolic 166 144  ?Diastolic 90 86 90  ?Pulse 84 81 80  ? ? ?CONSTITUTIONAL: Well-developed and well-nourished. No acute distress.  ?SKIN: Skin is warm and dry. No rash noted. No cyanosis. No pallor. No jaundice ?HEAD: Normocephalic and atraumatic.  ?EYES: No scleral icterus ?MOUTH/THROAT: Moist oral membranes.  ?NECK: No JVD present. No thyromegaly noted. No carotid bruits  ?LYMPHATIC: No visible cervical adenopathy.  ?CHEST Normal respiratory effort. No intercostal retractions  ?LUNGS: Clear to auscultation bilaterally.  No stridor. No wheezes. No rales.  ?CARDIOVASCULAR: Regular rate and rhythm,  positive S1-S2, no murmurs rubs or gallops appreciated. ?ABDOMINAL: Obese, soft, nontender, nondistended, positive bowel sounds in all 4 quadrants, no apparent ascites.  ?EXTREMITIES: No peripheral edema, warm to touch ?HEMATOLOGIC: No significant bruising ?NEUROLOGIC: Oriented to person, place, and time. Nonfocal. Normal muscle tone.  ?PSYCHIATRIC: Normal mood and affect. Normal behavior. Cooperative ? ?CARDIAC DATABASE: ?EKG: ?08/24/2021: Normal sinus rhythm, 74 bpm, normal axis, without underlying ischemia or injury pattern. ? ?Echocardiogram: ?No results found for this or any previous visit from the past 1095 days. ?  ? ?Stress Testing: ?No results found for this or any previous visit from the past 1095 days. ? ? ?Heart Catheterization: ?None ? ?LABORATORY DATA: ?Labs done at her PCP earlier this week - will request records.  ? ?IMPRESSION: ? ?  ICD-10-CM   ?1. Family history of heart disease  Z82.49 EKG 12-Lead  ?  CT CARDIAC SCORING ([facility] LOCATIONS ONLY)  ?  ?2. Precordial  pain  R07.2 PCV ECHOCARDIOGRAM COMPLETE  ?  CT CARDIAC SCORING ([facility] LOCATIONS ONLY)  ?  PCV CARDIAC STRESS TEST  ?  ?3. Essential hypertension  I10   ?  ?4. Mixed hyperlipidemia  E78.2   ?  ?5. Chronic obstructive pulmonary disease  J44.9   ?  ?6. Cigarette smoker  F17.210   ?  ?7. Hx of cocaine abuse (HCC)  F14.11   ?  ?  ? ?RECOMMENDATIONS: ?Laityn Bensen is a 51 y.o. African-American female whose past medical history and cardiac risk factors include: Hypertension, hyperlipidemia, hx of cocaine use, prediabetes, cigarette smoking (0.5ppd), anxiety/depression, GERD, COPD . ? ?Family history of heart disease / Precordial pain: ?Precordial discomfort appears to be noncardiac.  However, has multiple cardiovascular risk factors and therefore we will proceed with an ischemic evaluation. ?Echo will be ordered to evaluate for structural heart disease and left ventricular systolic function. ?Coronary artery calcium score for further risk  stratification.Marland Kitchen ?Exercise treadmill stress test to evaluate for functional status, exercise-induced ischemia, and chronotropic competence. ?Educated on the importance of improving her modifiable cardiovascular risk facto

## 2021-08-30 ENCOUNTER — Other Ambulatory Visit: Payer: 59

## 2021-08-30 ENCOUNTER — Ambulatory Visit: Payer: 59

## 2021-08-30 DIAGNOSIS — R072 Precordial pain: Secondary | ICD-10-CM

## 2021-09-21 ENCOUNTER — Ambulatory Visit: Payer: 59

## 2021-09-21 DIAGNOSIS — R072 Precordial pain: Secondary | ICD-10-CM

## 2021-09-24 ENCOUNTER — Other Ambulatory Visit: Payer: Self-pay

## 2021-09-28 NOTE — Progress Notes (Signed)
Called pt to inform her about her results. Pt understood  

## 2021-10-04 ENCOUNTER — Ambulatory Visit: Payer: 59 | Admitting: Dermatology

## 2021-10-04 DIAGNOSIS — Z79899 Other long term (current) drug therapy: Secondary | ICD-10-CM | POA: Diagnosis not present

## 2021-10-04 DIAGNOSIS — L732 Hidradenitis suppurativa: Secondary | ICD-10-CM

## 2021-10-04 MED ORDER — CLINDAMYCIN PHOSPHATE 1 % EX LOTN: TOPICAL_LOTION | CUTANEOUS | 3 refills | Status: DC

## 2021-10-04 MED ORDER — MINOCYCLINE HCL 100 MG PO CAPS: 100.0000 mg | ORAL_CAPSULE | Freq: Two times a day (BID) | ORAL | 1 refills | Status: AC

## 2021-10-04 NOTE — Progress Notes (Signed)
? ?  New Patient Visit ? ?Subjective  ?Mariah Price is a 51 y.o. female who presents for the following: Hidradenitis (Bil axilla, groin, > 20ys, Doxycycline 100mg  1 po bid x 1 yr, betadine to clean areas, pt felt the doxycycline helped in the beginning but does not help that much now). ? ? ?The following portions of the chart were reviewed this encounter and updated as appropriate:  ?  ?  ? ?Review of Systems:  No other skin or systemic complaints except as noted in HPI or Assessment and Plan. ? ?Objective  ?Well appearing patient in no apparent distress; mood and affect are within normal limits. ? ?A focused examination was performed including bil axilla, groin. Relevant physical exam findings are noted in the Assessment and Plan. ? ?bil axilla, groin ?Hyperpigmented ropey nodules c/w scars and cysts, including scarring and drainage at bil perineal, superpubic area, bil axilla  ? ? ? ?Assessment & Plan  ?Hidradenitis suppurativa ?bil axilla, groin ? ?Chronic and persistent condition with duration or expected duration over one year. Condition is bothersome/symptomatic for patient. Currently flared. Severe involvement ? ?Hidradenitis Suppurativa is a chronic; persistent; non-curable, but treatable condition due to abnormal inflamed sweat glands in the body folds (axilla, inframammary, groin, medial thighs), causing recurrent painful draining cysts and scarring. It can be associated with severe scarring acne and cysts; also abscesses and scarring of scalp. The goal is control and prevention of flares, as it is not curable. Scars are permanent and can be thickened. Treatment may include daily use of topical medication and oral antibiotics.  Oral isotretinoin may also be helpful.  For more severe cases, Humira (a biologic injection) may be prescribed to decrease the inflammatory process and prevent flares.  When indicated, inflamed cysts may also be treated surgically. ? ?Discussed treatment options Humira vs surgical  excision ?Pending labs will start Humira sq injections ?D/c Doxycycline ?Start Minocycline 100mg  1 po bid with food and drink ?Start Clindamycin lotion qd/bid to aa axilla and groin ?Start Panoxyl 4% creamy wash qd, let sit several minutes and rinse off ? ?Reviewed risks of biologics including immunosuppression, infections, injection site reaction, and failure to improve condition. Goal is control of skin condition, not cure.  Some older biologics such as Humira and Enbrel may slightly increase risk of malignancy and may worsen congestive heart failure. The use of biologics requires long term medication management, including periodic office visits and monitoring of blood work.  ? ?clindamycin (CLEOCIN-T) 1 % lotion - bil axilla, groin ?Apply topically as directed. Qd to bid to aa axilla and groin ? ?minocycline (MINOCIN) 100 MG capsule - bil axilla, groin ?Take 1 capsule (100 mg total) by mouth 2 (two) times daily. 1 po bid with food and drink ? ?Related Procedures ?Comprehensive metabolic panel ?CBC with Differential/Platelet ?Hepatitis B surface antibody,qualitative ?Hepatitis B surface antigen ?Hepatitis C antibody ?HIV Antibody (routine testing w rflx) ?QuantiFERON-TB Gold Plus ? ? ?Return in about 1 month (around 11/04/2021) for HS. ? ?I, , RMA, am acting as scribe for 01/04/2022, MD . ? ?Documentation: I have reviewed the above documentation for accuracy and completeness, and I agree with the above. ? ?Ardis Rowan MD  ? ? ?

## 2021-10-04 NOTE — Patient Instructions (Addendum)
Stop Doxycycline ?Start Minocycline 100mg  1 pill twice a day with food and drink ?Start Clindamycin lotion one to two times a day to aa axilla and groin ?Start Panoxyl 4% creamy wash once daily to wash underarms and in groin, let sit several minutes and rinse off... may bleach washcloths/towels ? ? ? ? ? ? ? ? ?If You Need Anything After Your Visit ? ?If you have any questions or concerns for your doctor, please call our main line at [phone] and press option 4 to reach your doctor's medical assistant. If no one answers, please leave a voicemail as directed and we will return your call as soon as possible. Messages left after 4 pm will be answered the following business day.  ? ?You may also send [phone] a message via MyChart. We typically respond to MyChart messages within 1-2 business days. ? ?For prescription refills, please ask your pharmacy to contact our office. Our fax number is [phone]. ? ?If you have an urgent issue when the clinic is closed that cannot wait until the next business day, you can page your doctor at the number below.   ? ?Please note that while we do our best to be available for urgent issues outside of office hours, we are not available 24/7.  ? ?If you have an urgent issue and are unable to reach [phone], you may choose to seek medical care at your doctor's office, retail clinic, urgent care center, or emergency room. ? ?If you have a medical emergency, please immediately call 911 or go to the emergency department. ? ?Pager Numbers ? ?- Dr. [place]: [phone] ? ?- Dr. [phone]: [phone] ? ?- Dr. [phone]: [phone] ? ?In the event of inclement weather, please call our main line at [phone] for an update on the status of any delays or closures. ? ?Dermatology Medication Tips: ?Please keep the boxes that topical medications come in in order to help keep track of the instructions about where and how to use these. Pharmacies typically print the medication instructions only on the boxes  and not directly on the medication tubes.  ? ?If your medication is too expensive, please contact our office at [phone] option 4 or send [phone] a message through MyChart.  ? ?We are unable to tell what your co-pay for medications will be in advance as this is different depending on your insurance coverage. However, we may be able to find a substitute medication at lower cost or fill out paperwork to get insurance to cover a needed medication.  ? ?If a prior authorization is required to get your medication covered by your insurance company, please allow [place] 1-2 business days to complete this process. ? ?Drug prices often vary depending on where the prescription is filled and some pharmacies may offer cheaper prices. ? ?The website www.goodrx.com contains coupons for medications through different pharmacies. The prices here do not account for what the cost may be with help from insurance (it may be cheaper with your insurance), but the website can give you the price if you did not use any insurance.  ?- You can print the associated coupon and take it with your prescription to the pharmacy.  ?- You may also stop by our office during regular business hours and pick up a GoodRx coupon card.  ?- If you need your prescription sent electronically to a different pharmacy, notify our office through [facility] or by phone at [phone] option 4. ? ? ? ? ?Si Usted Necesita Algo Despu?s  de Su Visita ? ?Tambi?n puede enviarnos un mensaje a trav?s de MyChart. Por lo general respondemos a los mensajes de MyChart en el transcurso de 1 a 2 d?as h?biles. ? ?Para renovar recetas, por favor pida a su farmacia que se ponga en contacto con nuestra oficina. Nuestro n?mero de fax es el [phone]. ? ?Si tiene un asunto urgente cuando la cl?nica est? cerrada y que no puede esperar hasta el siguiente d?a h?bil, puede llamar/localizar a su doctor(a) al n?mero que aparece a continuaci?n.  ? ?Por favor, tenga en cuenta que aunque  hacemos todo lo posible para estar disponibles para asuntos urgentes fuera del horario de oficina, no estamos disponibles las 24 horas del d?a, los 7 d?as de la semana.  ? ?Si tiene un problema urgente y no puede comunicarse con nosotros, puede optar por buscar atenci?n m?dica  en el consultorio de su doctor(a), en una cl?nica privada, en un centro de atenci?n urgente o en una sala de emergencias. ? ?Si tiene Radio broadcast assistant m?dica, por favor llame inmediatamente al 911 o vaya a la sala de emergencias. ? ?N?meros de b?per ? ?- Dr. Gwen Pounds: [phone] ? ?- Dra. Moye: [phone] ? ?- Dra. Roseanne Reno: [phone] ? ?En caso de inclemencias del tiempo, por favor llame a nuestra l?nea principal al [phone] para una actualizaci?n sobre el estado de cualquier retraso o cierre. ? ?Consejos para la medicaci?n en dermatolog?a: ?Por favor, guarde las cajas en las que vienen los medicamentos de uso t?pico para ayudarle a seguir las instrucciones sobre d?nde y c?mo usarlos. Las farmacias generalmente imprimen las instrucciones del medicamento s?lo en las cajas y no directamente en los tubos del [place].  ? ?Si su medicamento es muy caro, por favor, p?ngase en contacto con Rolm Gala llamando al [phone] y presione la opci?n 4 o env?enos un mensaje a trav?s de MyChart.  ? ?No podemos decirle cu?l ser? su copago por los medicamentos por adelantado ya que esto es diferente dependiendo de la cobertura de su seguro. Sin embargo, es posible que podamos encontrar un medicamento sustituto a Audiological scientist un formulario para que el seguro cubra el medicamento que se considera necesario.  ? ?Si se requiere Neomia Dear autorizaci?n previa para que su compa??a de seguros [place] su medicamento, por favor perm?tanos de 1 a 2 d?as h?biles para completar este proceso. ? ?Los precios de los medicamentos var?an con frecuencia dependiendo del Environmental consultant de d?nde se surte la receta y alguna farmacias pueden ofrecer precios m?s  baratos. ? ?El sitio web www.goodrx.com tiene cupones para medicamentos de Health and safety inspector. Los precios aqu? no tienen en cuenta lo que podr?a costar con la ayuda del seguro (puede ser m?s barato con su seguro), pero el sitio web puede darle el precio si no utiliz? ning?n seguro.  ?- Puede imprimir el cup?n correspondiente y llevarlo con su receta a la farmacia.  ?- Tambi?n puede pasar por nuestra oficina durante el horario de atenci?n regular y recoger una tarjeta de cupones de GoodRx.  ?- Si necesita que su receta se env?e electr?nicamente a Psychiatrist, informe a nuestra oficina a trav?s de MyChart de [place] o por tel?fono llamando al [phone] y presione la opci?n 4.  ?

## 2021-10-05 ENCOUNTER — Encounter: Payer: Self-pay | Admitting: Cardiology

## 2021-10-05 ENCOUNTER — Ambulatory Visit: Payer: 59 | Admitting: Cardiology

## 2021-10-05 VITALS — BP 112/80 | HR 96 | Temp 98.0°F | Resp 17 | Ht 64.0 in | Wt 209.0 lb

## 2021-10-05 DIAGNOSIS — E782 Mixed hyperlipidemia: Secondary | ICD-10-CM

## 2021-10-05 DIAGNOSIS — R072 Precordial pain: Secondary | ICD-10-CM

## 2021-10-05 DIAGNOSIS — Z8249 Family history of ischemic heart disease and other diseases of the circulatory system: Secondary | ICD-10-CM

## 2021-10-05 DIAGNOSIS — I10 Essential (primary) hypertension: Secondary | ICD-10-CM

## 2021-10-05 DIAGNOSIS — F17210 Nicotine dependence, cigarettes, uncomplicated: Secondary | ICD-10-CM

## 2021-10-05 DIAGNOSIS — F1411 Cocaine abuse, in remission: Secondary | ICD-10-CM

## 2021-10-05 DIAGNOSIS — J449 Chronic obstructive pulmonary disease, unspecified: Secondary | ICD-10-CM

## 2021-10-05 NOTE — Progress Notes (Signed)
? ?Date:  10/05/2021  ? ?ID:  Mariah Price, DOB [birth year], MRN [id number] ? ?PCP:  Lewis Moccasin, MD  ?Cardiologist:  Tessa Lerner, DO, [facility] (established care 08/24/2021) ?Former Cardiology Providers: Dr. Cecilie Kicks ([facility] 06/2017) ? ?Date: 10/05/21 ?Last Office Visit: 08/24/2021 ? ?Chief Complaint  ?Patient presents with  ? Follow-up  ?  Reevaluation of chest pain and discuss test results  ? ? ?HPI  ?Mariah Price is a 51 y.o. African-American female whose past medical history and cardiovascular risk factors include: Hypertension, hyperlipidemia, hx of cocaine use, prediabetes, cigarette smoking (0.5ppd), anxiety/depression, GERD, COPD. ? ?She was referred to the practice for evaluation of CAD due to family hx of heart disease. In addition she voiced concerns of precordial pain which is was likely non-cardiac. However, given her symptoms and risk factors she was recommended to have echo, GXT, and calcium score. Echo and GXT results reviewed with the patient and noted below. Clinically, her chest pain has resolved. Blood pressure has improved compared to before and she is now smoking less (4 cigarette / day).  ? ?Coronary calcium score and labs (done at PCP - per patient) are still pending.  ? ?FUNCTIONAL STATUS: ?No structured exercise program or daily routine.  ? ?ALLERGIES: ?Allergies  ?Allergen Reactions  ? Sulfa Antibiotics   ? ? ?MEDICATION LIST PRIOR TO VISIT: ?Current Meds  ?Medication Sig  ? budesonide-formoterol (SYMBICORT) 160-4.5 MCG/ACT inhaler Inhale into the lungs.  ? buPROPion (WELLBUTRIN XL) 150 MG 24 hr tablet Take 1 tablet by mouth daily.  ? clindamycin (CLEOCIN-T) 1 % lotion Apply topically as directed. Qd to bid to aa axilla and groin  ? escitalopram (LEXAPRO) 10 MG tablet Take 10 mg by mouth daily.  ? famotidine (PEPCID) 10 MG tablet Take 1 tablet by mouth as needed.  ? hydrochlorothiazide (HYDRODIURIL) 12.5 MG tablet Take 12.5 mg by mouth every morning.  ? hydrOXYzine (ATARAX) 25 MG  tablet Take 25 mg by mouth 4 (four) times daily.  ? ibuprofen (ADVIL) 400 MG tablet Take 1 tablet (400 mg total) by mouth every 6 (six) hours as needed.  ? ipratropium (ATROVENT HFA) 17 MCG/ACT inhaler Inhale into the lungs.  ? lidocaine (LIDODERM) 5 % Place 1 patch onto the skin daily. Remove & Discard patch within 12 hours or as directed by MD  ? linaclotide (LINZESS) 290 MCG CAPS capsule Take 1 capsule by mouth as needed.  ? losartan (COZAAR) 100 MG tablet Take 100 mg by mouth daily.  ? methocarbamol (ROBAXIN-750) 750 MG tablet Take 1 tablet (750 mg total) by mouth 4 (four) times daily.  ? minocycline (MINOCIN) 100 MG capsule Take 1 capsule (100 mg total) by mouth 2 (two) times daily. 1 po bid with food and drink  ? spironolactone (ALDACTONE) 25 MG tablet Take 25 mg by mouth every other day.  ?  ? ?PAST MEDICAL HISTORY: ?Past Medical History:  ?Diagnosis Date  ? Arthritis   ? Asthma   ? CHF (congestive heart failure) (HCC)   ? COPD (chronic obstructive pulmonary disease) (HCC)   ? Diverticulitis   ? Hypertension   ? Pancreatitis   ? ? ?PAST SURGICAL HISTORY: ?History reviewed. No pertinent surgical history. ? ?FAMILY HISTORY: ?The patient family history includes Heart attack in her nephew, nephew, and sister; Heart failure in her father; Multiple sclerosis in her mother. ? ?SOCIAL HISTORY:  ?The patient  reports that she has been smoking cigarettes. She has a 10.00 pack-year smoking history. She has never used  smokeless tobacco. She reports that she does not currently use alcohol. She reports that she does not currently use drugs. ? ?REVIEW OF SYSTEMS: ?Review of Systems  ?Cardiovascular:  Negative for chest pain, dyspnea on exertion, leg swelling, orthopnea, palpitations, paroxysmal nocturnal dyspnea and syncope.  ?Respiratory:  Positive for shortness of breath (chronic and stable).   ? ?PHYSICAL EXAM: ? ?  10/05/2021  ? 10:47 AM 08/24/2021  ? 11:00 AM 08/24/2021  ? 10:59 AM  ?Vitals with BMI  ?Height 5\' 4"   5\' 2"    ?Weight 209 lbs  213 lbs 10 oz  ?BMI 35.86  39.06  ?Systolic 112 166  ?Diastolic 80 90 86  ?Pulse 96 84 81  ? ? ?CONSTITUTIONAL: Well-developed and well-nourished. No acute distress.  ?SKIN: Skin is warm and dry. No rash noted. No cyanosis. No pallor. No jaundice ?HEAD: Normocephalic and atraumatic.  ?EYES: No scleral icterus ?MOUTH/THROAT: Moist oral membranes.  ?NECK: No JVD present. No thyromegaly noted. No carotid bruits  ?LYMPHATIC: No visible cervical adenopathy.  ?CHEST Normal respiratory effort. No intercostal retractions  ?LUNGS: Clear to auscultation bilaterally.  No stridor. No wheezes. No rales.  ?CARDIOVASCULAR: Regular rate and rhythm, positive S1-S2, no murmurs rubs or gallops appreciated. ?ABDOMINAL: Obese, soft, nontender, nondistended, positive bowel sounds in all 4 quadrants, no apparent ascites.  ?EXTREMITIES: No peripheral edema, warm to touch ?HEMATOLOGIC: No significant bruising ?NEUROLOGIC: Oriented to person, place, and time. Nonfocal. Normal muscle tone.  ?PSYCHIATRIC: Normal mood and affect. Normal behavior. Cooperative. ?No change in physical examination since the last office visit.  ? ?CARDIAC DATABASE: ?EKG: ?08/24/2021: Normal sinus rhythm, 74 bpm, normal axis, without underlying ischemia or injury pattern. ? ?Echocardiogram: ?08/30/2021: ?Normal LV systolic function with visual EF 60-65%. Left ventricle cavity is normal in size. Normal left ventricular wall thickness. Normal global wall motion. Normal diastolic filling pattern, normal LAP.  ?No significant valvular heart disease. ?No prior study for comparison.  ? ?Stress Testing: ?Exercise treadmill stress test 09/21/2021: ?Exercise treadmill stress test performed using Bruce protocol. Patient reached 5 METS, and 98% of age predicted maximum heart rate. Exercise capacity was low. No chest pain reported. Dyspnea and dizziness reported. Normal heart rate and hemodynamic response. Stress EKG revealed no ischemic changes. ?Low risk  study. ? ?Heart Catheterization: ?None ? ?LABORATORY DATA: ?Labs done at her PCP earlier this week - will request records.  ? ?IMPRESSION: ? ?  ICD-10-CM   ?1. Precordial pain  R07.2   ?  ?2. Family history of heart disease  Z82.49   ?  ?3. Essential hypertension  I10   ?  ?4. Mixed hyperlipidemia  E78.2   ?  ?5. Chronic obstructive pulmonary disease  J44.9   ?  ?6. Cigarette smoker  F17.210   ?  ?7. Hx of cocaine abuse (HCC)  F14.11   ?  ?  ? ?RECOMMENDATIONS: ?Mariah Price is a 51 y.o. African-American female whose past medical history and cardiac risk factors include: Hypertension, hyperlipidemia, hx of cocaine use, prediabetes, cigarette smoking (0.5ppd), anxiety/depression, GERD, COPD . ? ?Referred to the practice for cardiac evaluation given her family history of heart disease.  Review of systems also positive for precordial discomfort and given her risk factors that shared decision was to proceed with echo, GXT, and coronary calcium score. ? ?Results of the echocardiogram and GXT are overall favorable and no additional work-up is warranted for now as her precordial discomfort is resolved. ? ?Coronary artery calcium score still pending.  I still have  not received records from her PCP with regards to her most recent labs.  Patient states that she will have that arranged. ? ?She has reduced her smoking down to 4 cigarettes/day since last office visit and she has lost approximately 4 pounds as well due to lifestyle changes.  She appears to be now motivated to improve her modifiable cardiovascular risk factors.  Educated her on the importance of complete smoking cessation, management of hyperlipidemia, glycemic control.  ? ?I like to see her back in the clinic in 1 year.  In the interim she is asked to complete her coronary calcium score for further risk stratification and to have the labs forwarded to our practice for review.  If either of the labs or coronary calcium score warrants additional discussion we will  arrange an office visit sooner.  She is agreeable with the plan of care. ? ?Total time spent: 21 minutes. ? ?FINAL MEDICATION LIST END OF ENCOUNTER: ?No orders of the defined types were placed in this encount

## 2021-10-16 ENCOUNTER — Other Ambulatory Visit: Payer: Self-pay

## 2021-10-16 ENCOUNTER — Telehealth: Payer: Self-pay

## 2021-10-16 LAB — CBC WITH DIFFERENTIAL/PLATELET
Basophils Absolute: 0.1 10*3/uL (ref 0.0–0.2)
Basos: 1 %
EOS (ABSOLUTE): 0.1 10*3/uL (ref 0.0–0.4)
Eos: 2 %
Hematocrit: 44.5 % (ref 34.0–46.6)
Hemoglobin: 14.5 g/dL (ref 11.1–15.9)
Immature Grans (Abs): 0 10*3/uL (ref 0.0–0.1)
Immature Granulocytes: 0 %
Lymphocytes Absolute: 3.3 10*3/uL — ABNORMAL HIGH (ref 0.7–3.1)
Lymphs: 51 %
MCH: 24.4 pg — ABNORMAL LOW (ref 26.6–33.0)
MCHC: 32.6 g/dL (ref 31.5–35.7)
MCV: 75 fL — ABNORMAL LOW (ref 79–97)
Monocytes Absolute: 0.4 10*3/uL (ref 0.1–0.9)
Monocytes: 6 %
Neutrophils Absolute: 2.6 10*3/uL (ref 1.4–7.0)
Neutrophils: 40 %
Platelets: 335 10*3/uL (ref 150–450)
RBC: 5.95 x10E6/uL — ABNORMAL HIGH (ref 3.77–5.28)
RDW: 13.1 % (ref 11.7–15.4)
WBC: 6.5 10*3/uL (ref 3.4–10.8)

## 2021-10-16 LAB — QUANTIFERON-TB GOLD PLUS
QuantiFERON Mitogen Value: >10 IU/mL
QuantiFERON Nil Value: 0.78 IU/mL
QuantiFERON TB1 Ag Value: 0.24 IU/mL
QuantiFERON TB2 Ag Value: 0.35 IU/mL
QuantiFERON-TB Gold Plus: NEGATIVE

## 2021-10-16 LAB — COMPREHENSIVE METABOLIC PANEL
ALT: 8 IU/L (ref 0–32)
AST: 15 IU/L (ref 0–40)
Albumin/Globulin Ratio: 1.7 (ref 1.2–2.2)
Albumin: 4.5 g/dL (ref 3.8–4.9)
Alkaline Phosphatase: 91 IU/L (ref 44–121)
BUN/Creatinine Ratio: 15 (ref 9–23)
BUN: 10 mg/dL (ref 6–24)
Bilirubin Total: 0.3 mg/dL (ref 0.0–1.2)
CO2: 19 mmol/L — ABNORMAL LOW (ref 20–29)
Calcium: 9.7 mg/dL (ref 8.7–10.2)
Chloride: 101 mmol/L (ref 96–106)
Creatinine, Ser: 0.66 mg/dL (ref 0.57–1.00)
Globulin, Total: 2.7 g/dL (ref 1.5–4.5)
Glucose: 104 mg/dL — ABNORMAL HIGH (ref 70–99)
Potassium: 4.4 mmol/L (ref 3.5–5.2)
Sodium: 140 mmol/L (ref 134–144)
Total Protein: 7.2 g/dL (ref 6.0–8.5)
eGFR: 105 mL/min/{1.73_m2} (ref >59)

## 2021-10-16 LAB — HEPATITIS B SURFACE ANTIBODY,QUALITATIVE: Hep B Surface Ab, Qual: REACTIVE

## 2021-10-16 LAB — HEPATITIS C ANTIBODY: Hep C Virus Ab: NONREACTIVE

## 2021-10-16 LAB — HEPATITIS B SURFACE ANTIGEN: Hepatitis B Surface Ag: NEGATIVE

## 2021-10-16 LAB — HIV ANTIBODY (ROUTINE TESTING W REFLEX): HIV Screen 4th Generation wRfx: NONREACTIVE

## 2021-10-16 MED ORDER — HUMIRA-CD/UC/HS STARTER 80 MG/0.8ML ~~LOC~~ AJKT: 160.0000 mg | AUTO-INJECTOR | SUBCUTANEOUS | 0 refills | Status: AC

## 2021-10-16 MED ORDER — HUMIRA PEN 40 MG/0.8ML ~~LOC~~ PNKT: 40.0000 mg | PEN_INJECTOR | SUBCUTANEOUS | 5 refills | Status: DC

## 2021-10-16 NOTE — Telephone Encounter (Signed)
Called pt discussed normal labs, pt would like the Humira pen called in to the pharmacy

## 2021-10-16 NOTE — Telephone Encounter (Signed)
-----   Message from Willeen Niece, MD sent at 10/16/2021  9:16 AM EDT ----- Labs are okay, TB negative, Hep B Ab c/w immunity, Hep B Ag and Hep C Ab are negative, HIV negative - please call patient, please proceed with Humira Rx for HS

## 2021-10-16 NOTE — Progress Notes (Signed)
Humira start for HS sent to [facility]

## 2021-10-16 NOTE — Telephone Encounter (Signed)
Lft pt msg to call for lab results.  Need to ask pt if she prefers Humira in a pen or in a syringe.  Once we find out will send to [facility]

## 2021-11-07 ENCOUNTER — Ambulatory Visit: Payer: 59 | Admitting: Dermatology

## 2021-11-07 DIAGNOSIS — Z79899 Other long term (current) drug therapy: Secondary | ICD-10-CM | POA: Diagnosis not present

## 2021-11-07 DIAGNOSIS — L732 Hidradenitis suppurativa: Secondary | ICD-10-CM | POA: Diagnosis not present

## 2021-11-07 NOTE — Progress Notes (Signed)
   Follow-Up Visit   Subjective  Mariah Price is a 51 y.o. female who presents for the following: Follow-up (Hidradenitis of the bilateral axilla and groin. She is currently using clindamycin lotion twice daily and takes minocycline 100mg  BID. She is still waiting for Humira injections to be sent to her, but she has been in contact with company. ). She feels like the cysts drain and heal up faster on the minocycline.  The following portions of the chart were reviewed this encounter and updated as appropriate:       Review of Systems:  No other skin or systemic complaints except as noted in HPI or Assessment and Plan.  Objective  Well appearing patient in no apparent distress; mood and affect are within normal limits.  A focused examination was performed including face, axilla. Relevant physical exam findings are noted in the Assessment and Plan.  bil axilla, groin Ropey nodules, c/w scarring and cysts of the bil axilla    Assessment & Plan  Hidradenitis suppurativa bil axilla, groin  Chronic and persistent condition with duration or expected duration over one year. Condition is bothersome/symptomatic for patient. Some improvement on minocycline. Severe involvement  Hidradenitis Suppurativa is a chronic; persistent; non-curable, but treatable condition due to abnormal inflamed sweat glands in the body folds (axilla, inframammary, groin, medial thighs), causing recurrent painful draining cysts and scarring. It can be associated with severe scarring acne and cysts; also abscesses and scarring of scalp. The goal is control and prevention of flares, as it is not curable. Scars are permanent and can be thickened. Treatment may include daily use of topical medication and oral antibiotics.  Oral isotretinoin may also be helpful.  For more severe cases, Humira (a biologic injection) may be prescribed to decrease the inflammatory process and prevent flares.  When indicated, inflamed cysts may also  be treated surgically.  Continue Minocycline 100mg  1 po bid with food and drink Continue Clindamycin lotion qd/bid to aa axilla and groin Continue Panoxyl 4% creamy wash qd, let sit several minutes and rinse off  Plan to start Humira injections. Patient will call for injection training when she receives shipment.  Reviewed risks of biologics including immunosuppression, infections, injection site reaction, and failure to improve condition. Goal is control of skin condition, not cure.  Some older biologics such as Humira and Enbrel may slightly increase risk of malignancy and may worsen congestive heart failure.  Talz and Cosentyx may cause inflammatory bowel disease to flare. The use of biologics requires long term medication management, including periodic office visits and monitoring of blood work.   Related Medications clindamycin (CLEOCIN-T) 1 % lotion Apply topically as directed. Qd to bid to aa axilla and groin   Return in about 3 months (around 02/07/2022) for HS.  I , CMA, am acting as scribe for 02/09/2022, MD . Documentation: I have reviewed the above documentation for accuracy and completeness, and I agree with the above.  Cherlyn Labella MD

## 2021-11-07 NOTE — Patient Instructions (Addendum)
Start Benzoyl Peroxide Wash - Apply to affected areas in shower and rinse off (PanOxyl 4% Creamy Wash, Cetaphil Acne Wash, CeraVe Foaming Cream Cleanser) Benzoyl peroxide can cause dryness and irritation of the skin. It can also bleach fabric. When used together with Aczone (dapsone) cream, it can stain the skin orange.  Reviewed risks of biologics including immunosuppression, infections, injection site reaction, and failure to improve condition. Goal is control of skin condition, not cure.  Some older biologics such as Humira and Enbrel may slightly increase risk of malignancy and may worsen congestive heart failure.  Talz and Cosentyx may cause inflammatory bowel disease to flare. The use of biologics requires long term medication management, including periodic office visits and monitoring of blood work.  Due to recent changes in healthcare laws, you may see results of your pathology and/or laboratory studies on MyChart before the doctors have had a chance to review them. We understand that in some cases there may be results that are confusing or concerning to you. Please understand that not all results are received at the same time and often the doctors may need to interpret multiple results in order to provide you with the best plan of care or course of treatment. Therefore, we ask that you please give [place] 2 business days to thoroughly review all your results before contacting the office for clarification. Should we see a critical lab result, you will be contacted sooner.   If You Need Anything After Your Visit  If you have any questions or concerns for your doctor, please call our main line at [phone] and press option 4 to reach your doctor's medical assistant. If no one answers, please leave a voicemail as directed and we will return your call as soon as possible. Messages left after 4 pm will be answered the following business day.   You may also send [place] a message via MyChart. We typically  respond to MyChart messages within 1-2 business days.  For prescription refills, please ask your pharmacy to contact our office. Our fax number is [phone].  If you have an urgent issue when the clinic is closed that cannot wait until the next business day, you can page your doctor at the number below.    Please note that while we do our best to be available for urgent issues outside of office hours, we are not available 24/7.   If you have an urgent issue and are unable to reach [place], you may choose to seek medical care at your doctor's office, retail clinic, urgent care center, or emergency room.  If you have a medical emergency, please immediately call 911 or go to the emergency department.  Pager Numbers  - Dr. Gwen Pounds: [phone]  - Dr. Neale Burly: [phone]  - Dr. Roseanne Reno: [phone]  In the event of inclement weather, please call our main line at [phone] for an update on the status of any delays or closures.  Dermatology Medication Tips: Please keep the boxes that topical medications come in in order to help keep track of the instructions about where and how to use these. Pharmacies typically print the medication instructions only on the boxes and not directly on the medication tubes.   If your medication is too expensive, please contact our office at [phone] option 4 or send [place] a message through MyChart.   We are unable to tell what your co-pay for medications will be in advance as this is different depending on your insurance coverage. However, we may be  able to find a substitute medication at lower cost or fill out paperwork to get insurance to cover a needed medication.   If a prior authorization is required to get your medication covered by your insurance company, please allow us 1-2 business days to complete this process.  Drug prices often vary depending on where the prescription is filled and some pharmacies may offer cheaper prices.  The website  www.goodrx.com contains coupons for medications through different pharmacies. The prices here do not account for what the cost may be with help from insurance (it may be cheaper with your insurance), but the website can give you the price if you did not use any insurance.  - You can print the associated coupon and take it with your prescription to the pharmacy.  - You may also stop by our office during regular business hours and pick up a GoodRx coupon card.  - If you need your prescription sent electronically to a different pharmacy, notify our office through [facility] MyChart or by phone at [phone] option 4.     Si Usted Necesita Algo Despus de Su Visita  Tambin puede enviarnos un mensaje a travs de Clinical cytogeneticistMyChart. Por lo general respondemos a los mensajes de MyChart en el transcurso de 1 a 2 das hbiles.  Para renovar recetas, por favor pida a su farmacia que se ponga en contacto con nuestra oficina. Annie SableNuestro nmero de fax es [place] [phone].  Si tiene un asunto urgente cuando la clnica est cerrada y que no puede esperar hasta el siguiente da hbil, puede llamar/localizar a su doctor(a) al nmero que aparece a continuacin.   Por favor, tenga en cuenta que aunque hacemos todo lo posible para estar disponibles para asuntos urgentes fuera del horario de [place], no estamos disponibles las 24 horas del da, los 7 [address]  [address]das de la [place].   Si tiene un problema urgente y no puede comunicarse con nosotros, puede optar por buscar atencin mdica  en el consultorio de su doctor(a), en una clnica privada, en un centro de atencin urgente o en una sala de emergencias.  Si tiene Engineer, drillinguna emergencia mdica, por favor llame inmediatamente al 911 o vaya a la sala de emergencias.  Nmeros de bper  - Dr. Gwen PoundsKowalski: [phone]  - Dra. Moye: [phone]  - Dra. Roseanne RenoStewart: [phone]  En caso de inclemencias del [place], por favor llame a Lacy Duverneynuestra lnea principal al [phone] para una actualizacin  sobre el [place] de cualquier retraso o cierre.  Consejos para la medicacin en dermatologa: Por favor, guarde las cajas en las que vienen los medicamentos de uso tpico para ayudarle a seguir las instrucciones sobre dnde y cmo usarlos. Las farmacias generalmente imprimen las instrucciones del medicamento slo en las cajas y no directamente en los tubos del [place] Ranchmedicamento.   Si su medicamento es muy caro, por favor, pngase en contacto con Rolm Galanuestra oficina llamando al [phone] y presione la opcin 4 o envenos un mensaje a travs de Clinical cytogeneticistMyChart.   No podemos decirle cul ser su copago por los medicamentos por adelantado ya que esto es diferente dependiendo de la cobertura de su seguro. Sin embargo, es posible que podamos encontrar un medicamento sustituto a Audiological scientistmenor costo o llenar un formulario para que el seguro cubra el medicamento que se considera necesario.   Si se requiere una autorizacin previa para que su compaa de seguros [place]ub[place] su medicamento, por favor permtanos de 1 a 2 das hbiles para completar [address] proces[address].  Los precios de los medicamentos varan con  frecuencia dependiendo del lugar de dnde se surte la receta y alguna farmacias pueden ofrecer precios ms baratos.  El sitio web www.goodrx.com tiene cupones para medicamentos de Health and safety inspector. Los precios aqu no tienen en cuenta lo que podra costar con la ayuda del seguro (puede ser ms barato con su seguro), pero el sitio web puede darle el precio si no utiliz Tourist information centre manager.  - Puede imprimir el cupn correspondiente y llevarlo con su receta a la farmacia.  - Tambin puede pasar por nuestra oficina durante el horario de atencin regular y Education officer, museum una tarjeta de cupones de GoodRx.  - Si necesita que su receta se enve electrnicamente a una farmacia diferente, informe a nuestra oficina a travs de MyChart de [place] o por telfono llamando al [phone] y presione la opcin 4.

## 2021-11-14 ENCOUNTER — Ambulatory Visit (INDEPENDENT_AMBULATORY_CARE_PROVIDER_SITE_OTHER): Payer: 59 | Admitting: Dermatology

## 2021-11-14 ENCOUNTER — Telehealth: Payer: Self-pay

## 2021-11-14 ENCOUNTER — Other Ambulatory Visit: Payer: Self-pay

## 2021-11-14 DIAGNOSIS — L732 Hidradenitis suppurativa: Secondary | ICD-10-CM

## 2021-11-14 MED ORDER — HUMIRA (2 PEN) 80 MG/0.8ML ~~LOC~~ PNKT: 80.0000 mg | PEN_INJECTOR | SUBCUTANEOUS | 5 refills | Status: DC

## 2021-11-14 MED ORDER — ADALIMUMAB 80 MG/0.8ML ~~LOC~~ AJKT
160.0000 mg | AUTO-INJECTOR | Freq: Once | SUBCUTANEOUS | Status: AC
  Administered 2021-11-14: 160 mg via SUBCUTANEOUS

## 2021-11-14 NOTE — Progress Notes (Signed)
Patient here today to start Humira injections for chronic and persistent hidradenitis suppurativa.  Humira 80mg  X2 pens were injected into left and right thigh today. Patient tolerated well. Abby assisted with patient and we were able to inject at the same time due to patient's needle phobia.   LOT: EXP: 09/2022  10/2022, RMA   Documentation: I have reviewed the above documentation for accuracy and completeness, and I agree with the above.  Dorathy Daft MD

## 2021-11-14 NOTE — Progress Notes (Signed)
Updated Humira injection per Dr. Marca Ancona note in phone call. aw

## 2021-11-14 NOTE — Telephone Encounter (Signed)
Patient came in today to start Humira injections for HS. I wanted to confirm dosing for patient.   We injected the 160mg  loading dose today, she will do the 80mg  on day 15 and her RX for maintenance shows 40mg  weekly.   I have to complete a PA for maintenance dose and wanted to confirm you wanted 40mg  weekly and not the 80mg  every other week.

## 2021-11-14 NOTE — Telephone Encounter (Signed)
Humira dose updated per Dr. Roseanne Reno. aw

## 2021-12-27 DIAGNOSIS — Z7182 Exercise counseling: Secondary | ICD-10-CM | POA: Diagnosis not present

## 2021-12-27 DIAGNOSIS — Z713 Dietary counseling and surveillance: Secondary | ICD-10-CM | POA: Diagnosis not present

## 2021-12-27 DIAGNOSIS — I10 Essential (primary) hypertension: Secondary | ICD-10-CM | POA: Diagnosis not present

## 2021-12-27 DIAGNOSIS — Z0000 Encounter for general adult medical examination without abnormal findings: Secondary | ICD-10-CM | POA: Diagnosis not present

## 2021-12-27 DIAGNOSIS — Z23 Encounter for immunization: Secondary | ICD-10-CM | POA: Diagnosis not present

## 2022-01-17 DIAGNOSIS — R7303 Prediabetes: Secondary | ICD-10-CM | POA: Diagnosis not present

## 2022-01-17 DIAGNOSIS — L732 Hidradenitis suppurativa: Secondary | ICD-10-CM | POA: Diagnosis not present

## 2022-01-17 DIAGNOSIS — Z720 Tobacco use: Secondary | ICD-10-CM | POA: Diagnosis not present

## 2022-01-17 DIAGNOSIS — Z1211 Encounter for screening for malignant neoplasm of colon: Secondary | ICD-10-CM | POA: Diagnosis not present

## 2022-01-17 DIAGNOSIS — E669 Obesity, unspecified: Secondary | ICD-10-CM | POA: Diagnosis not present

## 2022-01-17 DIAGNOSIS — E1165 Type 2 diabetes mellitus with hyperglycemia: Secondary | ICD-10-CM | POA: Diagnosis not present

## 2022-01-17 DIAGNOSIS — K5904 Chronic idiopathic constipation: Secondary | ICD-10-CM | POA: Diagnosis not present

## 2022-01-17 DIAGNOSIS — E162 Hypoglycemia, unspecified: Secondary | ICD-10-CM | POA: Diagnosis not present

## 2022-01-22 DIAGNOSIS — E669 Obesity, unspecified: Secondary | ICD-10-CM | POA: Diagnosis not present

## 2022-01-22 DIAGNOSIS — K5904 Chronic idiopathic constipation: Secondary | ICD-10-CM | POA: Diagnosis not present

## 2022-01-22 DIAGNOSIS — Z1211 Encounter for screening for malignant neoplasm of colon: Secondary | ICD-10-CM | POA: Diagnosis not present

## 2022-01-22 DIAGNOSIS — L732 Hidradenitis suppurativa: Secondary | ICD-10-CM | POA: Diagnosis not present

## 2022-01-28 ENCOUNTER — Emergency Department (HOSPITAL_COMMUNITY): Payer: 59

## 2022-01-28 ENCOUNTER — Other Ambulatory Visit: Payer: Self-pay

## 2022-01-28 ENCOUNTER — Encounter (HOSPITAL_COMMUNITY): Payer: Self-pay

## 2022-01-28 ENCOUNTER — Emergency Department (HOSPITAL_COMMUNITY)
Admission: EM | Admit: 2022-01-28 | Discharge: 2022-01-28 | Disposition: A | Discharge status: Home / Self Care | Payer: 59 | Attending: Emergency Medicine | Admitting: Emergency Medicine

## 2022-01-28 DIAGNOSIS — J449 Chronic obstructive pulmonary disease, unspecified: Secondary | ICD-10-CM | POA: Diagnosis not present

## 2022-01-28 DIAGNOSIS — I110 Hypertensive heart disease with heart failure: Secondary | ICD-10-CM | POA: Insufficient documentation

## 2022-01-28 DIAGNOSIS — R519 Headache, unspecified: Secondary | ICD-10-CM

## 2022-01-28 DIAGNOSIS — J45909 Unspecified asthma, uncomplicated: Secondary | ICD-10-CM | POA: Insufficient documentation

## 2022-01-28 DIAGNOSIS — R1084 Generalized abdominal pain: Secondary | ICD-10-CM | POA: Diagnosis not present

## 2022-01-28 DIAGNOSIS — I509 Heart failure, unspecified: Secondary | ICD-10-CM | POA: Diagnosis not present

## 2022-01-28 DIAGNOSIS — R112 Nausea with vomiting, unspecified: Secondary | ICD-10-CM | POA: Diagnosis not present

## 2022-01-28 LAB — COMPREHENSIVE METABOLIC PANEL
ALT: 10 U/L (ref 0–44)
AST: 14 U/L — ABNORMAL LOW (ref 15–41)
Albumin: 4 g/dL (ref 3.5–5.0)
Alkaline Phosphatase: 54 U/L (ref 38–126)
Anion gap: 10 (ref 5–15)
BUN: 9 mg/dL (ref 6–20)
CO2: 24 mmol/L (ref 22–32)
Calcium: 9.4 mg/dL (ref 8.9–10.3)
Chloride: 105 mmol/L (ref 98–111)
Creatinine, Ser: 0.62 mg/dL (ref 0.44–1.00)
GFR, Estimated: >60 mL/min (ref >60)
Glucose, Bld: 99 mg/dL (ref 70–99)
Potassium: 4 mmol/L (ref 3.5–5.1)
Sodium: 139 mmol/L (ref 135–145)
Total Bilirubin: 0.7 mg/dL (ref 0.3–1.2)
Total Protein: 7.3 g/dL (ref 6.5–8.1)

## 2022-01-28 LAB — URINALYSIS, ROUTINE W REFLEX MICROSCOPIC
Bilirubin Urine: NEGATIVE
Glucose, UA: NEGATIVE mg/dL
Hgb urine dipstick: NEGATIVE
Ketones, ur: NEGATIVE mg/dL
Nitrite: NEGATIVE
Protein, ur: NEGATIVE mg/dL
Specific Gravity, Urine: 1.013 (ref 1.005–1.030)
pH: 8 (ref 5.0–8.0)

## 2022-01-28 LAB — CBC
HCT: 44.5 % (ref 36.0–46.0)
Hemoglobin: 14.3 g/dL (ref 12.0–15.0)
MCH: 25.2 pg — ABNORMAL LOW (ref 26.0–34.0)
MCHC: 32.1 g/dL (ref 30.0–36.0)
MCV: 78.3 fL — ABNORMAL LOW (ref 80.0–100.0)
Platelets: 256 10*3/uL (ref 150–400)
RBC: 5.68 MIL/uL — ABNORMAL HIGH (ref 3.87–5.11)
RDW: 14.4 % (ref 11.5–15.5)
WBC: 6.8 10*3/uL (ref 4.0–10.5)
nRBC: 0 % (ref 0.0–0.2)

## 2022-01-28 LAB — LIPASE, BLOOD: Lipase: 63 U/L — ABNORMAL HIGH (ref 11–51)

## 2022-01-28 MED ORDER — PROCHLORPERAZINE EDISYLATE 10 MG/2ML IJ SOLN
10.0000 mg | Freq: Once | INTRAMUSCULAR | Status: AC
  Administered 2022-01-28: 10 mg via INTRAVENOUS
  Filled 2022-01-28: qty 2

## 2022-01-28 MED ORDER — ONDANSETRON 4 MG PO TBDP: 4.0000 mg | ORAL_TABLET | Freq: Three times a day (TID) | ORAL | 0 refills | Status: AC | PRN

## 2022-01-28 NOTE — ED Triage Notes (Signed)
Patient reports that she began having a headache and hypertension 167/109 at home) last night.  Patient also reports abdominal pain and vomiting x 2 this AM and states pain lessened after vomiting.

## 2022-01-28 NOTE — Discharge Instructions (Signed)

## 2022-01-28 NOTE — ED Provider Notes (Signed)
Emergency Department Provider Note   I have reviewed the triage vital signs and the nursing notes.   HISTORY  Chief Complaint Headache, Hypertension, Emesis, and Abdominal Pain   HPI Mariah Price is a 51 y.o. female past history reviewed below presents to the emergency department with abdominal pain, vomiting, headache, elevated blood pressure.  Patient states last night she felt like she had some "gas pain" in the abdomen with nausea.  This continued through the night and at 4:30 AM she woke up with a headache which was mild at first and gradually progressed to be more severe.  She took her blood pressure and found it to be elevated in the systolic 160 range.  She does take her blood pressure medication including this morning.  Her abdominal pain seemed to worsen which led to 2 episodes of nonbloody emesis.  Her abdominal pain is decreased somewhat since then but still feels a central abdominal discomfort.  No numbness or weakness.  No vision changes. She tried going to work but upon repeating her blood pressure found it to be increasing gradually and so presented here for evaluation.   Past Medical History:  Diagnosis Date   Arthritis    Asthma    CHF (congestive heart failure) (HCC)    COPD (chronic obstructive pulmonary disease) (HCC)    Diverticulitis    Hypertension    Pancreatitis     Review of Systems  Constitutional: No fever/chills Eyes: No visual changes. Cardiovascular: Denies chest pain. Respiratory: Denies shortness of breath. Gastrointestinal: Positive abdominal pain. Positive nausea and vomiting.  No diarrhea.  No constipation. Genitourinary: Negative for dysuria. Musculoskeletal: Negative for back pain. Skin: Negative for rash. Neurological: Negative for focal weakness or numbness. Positive HA.    ____________________________________________   PHYSICAL EXAM:  VITAL SIGNS: ED Triage Vitals  Enc Vitals Group     BP 01/28/22 1012 (!) 147/103     Pulse  Rate 01/28/22 1012 74     Resp 01/28/22 1012 20     Temp 01/28/22 1012 98.3 F (36.8 C)     Temp Source 01/28/22 1012 Oral     SpO2 01/28/22 1012 100 %     Weight 01/28/22 1013 192 lb (87.1 kg)     Height 01/28/22 1013 5\' 3"  (1.6 m)   Constitutional: Alert and oriented. Well appearing and in no acute distress. Eyes: Conjunctivae are normal.  Head: Atraumatic. Nose: No congestion/rhinnorhea. Mouth/Throat: Mucous membranes are moist.   Neck: No stridor.  No meningeal signs.  Cardiovascular: Normal rate, regular rhythm. Good peripheral circulation. Grossly normal heart sounds.   Respiratory: Normal respiratory effort.  No retractions. Lungs CTAB. Gastrointestinal: Soft and nontender. No distention.  Musculoskeletal: No lower extremity tenderness nor edema. No gross deformities of extremities. Neurologic:  Normal speech and language. No gross focal neurologic deficits are appreciated.  Skin:  Skin is warm, dry and intact. No rash noted. ____________________________________________   LABS (all labs ordered are listed, but only abnormal results are displayed)  Labs Reviewed  LIPASE, BLOOD - Abnormal; Notable for the following components:      Result Value   Lipase 63 (*)    All other components within normal limits  COMPREHENSIVE METABOLIC PANEL - Abnormal; Notable for the following components:   AST 14 (*)    All other components within normal limits  CBC - Abnormal; Notable for the following components:   RBC 5.68 (*)    MCV 78.3 (*)    MCH 25.2 (*)  All other components within normal limits  URINALYSIS, ROUTINE W REFLEX MICROSCOPIC - Abnormal; Notable for the following components:   Leukocytes,Ua TRACE (*)    Bacteria, UA RARE (*)    All other components within normal limits   ____________________________________________  RADIOLOGY  CT Head Wo Contrast  Result Date: 01/28/2022 CLINICAL DATA:  Headache.  Hypertension. EXAM: CT HEAD WITHOUT CONTRAST TECHNIQUE: Contiguous  axial images were obtained from the base of the skull through the vertex without intravenous contrast. RADIATION DOSE REDUCTION: This exam was performed according to the departmental dose-optimization program which includes automated exposure control, adjustment of the mA and/or kV according to patient size and/or use of iterative reconstruction technique. COMPARISON:  No comparison studies available. FINDINGS: Brain: There is no evidence for acute hemorrhage, hydrocephalus, mass lesion, or abnormal extra-axial fluid collection. No definite CT evidence for acute infarction. Vascular: No hyperdense vessel or unexpected calcification. Skull: Normal. Negative for fracture or focal lesion. Sinuses/Orbits: No acute finding. Other: None. IMPRESSION: No acute intracranial abnormality. Electronically Signed   By: Kennith Center M.D.   On: 01/28/2022 12:14    ____________________________________________   PROCEDURES  Procedure(s) performed:   Procedures  None  ____________________________________________   INITIAL IMPRESSION / ASSESSMENT AND PLAN / ED COURSE  Pertinent labs & imaging results that were available during my care of the patient were reviewed by me and considered in my medical decision making (see chart for details).   This patient is Presenting for Evaluation of HA, which does require a range of treatment options, and is a complaint that involves a high risk of morbidity and mortality.  The Differential Diagnoses includes but is not exclusive to subarachnoid hemorrhage, meningitis, encephalitis, previous head trauma, cavernous venous thrombosis, muscle tension headache, glaucoma, temporal arteritis, migraine or migraine equivalent, etc.   Critical Interventions-    Medications  prochlorperazine (COMPAZINE) injection 10 mg (10 mg Intravenous Given 01/28/22 1049)    Reassessment after intervention: Symptoms improved.    I decided to review pertinent External Data, and in summary last  ED visit was September 2022 for an unrelated complaint.   Clinical Laboratory Tests Ordered, included CBC without leukocytosis.  Mild lipase elevation to 63 but not severe.  LFTs and bilirubin are normal.  No AKI.  Normal electrolytes.  No UTI.  Radiologic Tests Ordered, included CT head. I independently interpreted the images and agree with radiology interpretation.   Cardiac Monitor Tracing which shows NSR.   Social Determinants of Health Risk patient is a smoker.   Medical Decision Making: Summary:  Patient presents emergency department abdominal discomfort along with nausea vomiting gradually increasing headache.  Lower suspicion for acute hypertensive emergency, bleed, CNS infection but given worsening pain and vomiting do plan for CT imaging of the head along with labs and nausea/headache medication.  Reevaluation with update and discussion with patient.  After treatment her blood pressure has normalized with symptom management.  Doubt acute hypertensive urgency/emergency.  No evidence of endorgan damage or bleed on CT or lab work.  She is overall feeling much better.  Plan for discharge home with nausea medication and plan for close PCP follow up.   Considered admission but symptoms are significantly improved and no significant abnormality found on imaging or labs.   Disposition: discharge  ____________________________________________  FINAL CLINICAL IMPRESSION(S) / ED DIAGNOSES  Final diagnoses:  Bad headache  Nausea and vomiting, unspecified vomiting type  Generalized abdominal pain     NEW OUTPATIENT MEDICATIONS STARTED DURING THIS VISIT:  New  Prescriptions   ONDANSETRON (ZOFRAN-ODT) 4 MG DISINTEGRATING TABLET    Take 1 tablet (4 mg total) by mouth every 8 (eight) hours as needed for nausea or vomiting.    Note:  This document was prepared using Dragon voice recognition software and may include unintentional dictation errors.  Alona Bene, MD, [facility] Emergency  Medicine    Kapil Petropoulos, Arlyss Repress, MD 01/28/22 1228

## 2022-01-31 DIAGNOSIS — Z1231 Encounter for screening mammogram for malignant neoplasm of breast: Secondary | ICD-10-CM | POA: Diagnosis not present

## 2022-02-05 DIAGNOSIS — E1121 Type 2 diabetes mellitus with diabetic nephropathy: Secondary | ICD-10-CM | POA: Diagnosis not present

## 2022-02-05 DIAGNOSIS — I10 Essential (primary) hypertension: Secondary | ICD-10-CM | POA: Diagnosis not present

## 2022-02-05 DIAGNOSIS — J449 Chronic obstructive pulmonary disease, unspecified: Secondary | ICD-10-CM | POA: Diagnosis not present

## 2022-02-05 DIAGNOSIS — Z720 Tobacco use: Secondary | ICD-10-CM | POA: Diagnosis not present

## 2022-02-05 DIAGNOSIS — K581 Irritable bowel syndrome with constipation: Secondary | ICD-10-CM | POA: Diagnosis not present

## 2022-02-05 DIAGNOSIS — E1165 Type 2 diabetes mellitus with hyperglycemia: Secondary | ICD-10-CM | POA: Diagnosis not present

## 2022-02-06 ENCOUNTER — Other Ambulatory Visit: Payer: Self-pay | Admitting: Nurse Practitioner

## 2022-02-06 DIAGNOSIS — Z720 Tobacco use: Secondary | ICD-10-CM

## 2022-02-06 DIAGNOSIS — F17200 Nicotine dependence, unspecified, uncomplicated: Secondary | ICD-10-CM

## 2022-02-11 ENCOUNTER — Ambulatory Visit: Payer: 59 | Admitting: Dermatology

## 2022-02-12 ENCOUNTER — Other Ambulatory Visit: Payer: Self-pay

## 2022-02-12 MED ORDER — HUMIRA (2 PEN) 80 MG/0.8ML ~~LOC~~ PNKT: 80.0000 mg | PEN_INJECTOR | SUBCUTANEOUS | 3 refills | Status: DC

## 2022-02-12 NOTE — Progress Notes (Signed)
New script sent in due to patient switching insurances. Escripted to [employer]

## 2022-02-27 ENCOUNTER — Ambulatory Visit: Payer: 59 | Admitting: Dermatology

## 2022-02-27 DIAGNOSIS — L732 Hidradenitis suppurativa: Secondary | ICD-10-CM

## 2022-02-27 MED ORDER — CLINDAMYCIN PHOSPHATE 1 % EX LOTN: TOPICAL_LOTION | CUTANEOUS | 5 refills | Status: AC

## 2022-02-27 MED ORDER — MINOCYCLINE HCL 100 MG PO CAPS: 100.0000 mg | ORAL_CAPSULE | Freq: Two times a day (BID) | ORAL | 4 refills | Status: AC

## 2022-02-27 NOTE — Patient Instructions (Signed)
Reviewed risks of biologics including immunosuppression, infections, injection site reaction, and failure to improve condition. Goal is control of skin condition, not cure.  Some older biologics such as Humira and Enbrel may slightly increase risk of malignancy and may worsen congestive heart failure.  Talz and Cosentyx may cause inflammatory bowel disease to flare. The use of biologics requires long term medication management, including periodic office visits and monitoring of blood work.  Benzoyl peroxide can cause dryness and irritation of the skin. It can also bleach fabric. When used together with Aczone (dapsone) cream, it can stain the skin orange.  Due to recent changes in healthcare laws, you may see results of your pathology and/or laboratory studies on MyChart before the doctors have had a chance to review them. We understand that in some cases there may be results that are confusing or concerning to you. Please understand that not all results are received at the same time and often the doctors may need to interpret multiple results in order to provide you with the best plan of care or course of treatment. Therefore, we ask that you please give [place] 2 business days to thoroughly review all your results before contacting the office for clarification. Should we see a critical lab result, you will be contacted sooner.   If You Need Anything After Your Visit  If you have any questions or concerns for your doctor, please call our main line at [phone] and press option 4 to reach your doctor's medical assistant. If no one answers, please leave a voicemail as directed and we will return your call as soon as possible. Messages left after 4 pm will be answered the following business day.   You may also send [place] a message via [place]. We typically respond to MyChart messages within 1-2 business days.  For prescription refills, please ask your pharmacy to contact our office. Our fax number is  [phone].  If you have an urgent issue when the clinic is closed that cannot wait until the next business day, you can page your doctor at the number below.    Please note that while we do our best to be available for urgent issues outside of office hours, we are not available 24/7.   If you have an urgent issue and are unable to reach [place], you may choose to seek medical care at your doctor's office, retail clinic, urgent care center, or emergency room.  If you have a medical emergency, please immediately call 911 or go to the emergency department.  Pager Numbers  - Dr. Nehemiah Massed: [phone]  - Dr. Laurence Ferrari: [phone]  - Dr. Nicole Kindred: [phone]  In the event of inclement weather, please call our main line at [phone] for an update on the status of any delays or closures.  Dermatology Medication Tips: Please keep the boxes that topical medications come in in order to help keep track of the instructions about where and how to use these. Pharmacies typically print the medication instructions only on the boxes and not directly on the medication tubes.   If your medication is too expensive, please contact our office at [phone] option 4 or send [place] a message through [place].   We are unable to tell what your co-pay for medications will be in advance as this is different depending on your insurance coverage. However, we may be able to find a substitute medication at lower cost or fill out paperwork to get insurance to cover a needed medication.   If a  prior authorization is required to get your medication covered by your insurance company, please allow [place] 1-2 business days to complete this process.  Drug prices often vary depending on where the prescription is filled and some pharmacies may offer cheaper prices.  The website www.goodrx.com contains coupons for medications through different pharmacies. The prices here do not account for what the cost may be with help from  insurance (it may be cheaper with your insurance), but the website can give you the price if you did not use any insurance.  - You can print the associated coupon and take it with your prescription to the pharmacy.  - You may also stop by our office during regular business hours and pick up a GoodRx coupon card.  - If you need your prescription sent electronically to a different pharmacy, notify our office through [facility] or by phone at [phone] option 4.     Si Usted Necesita Algo Despus de Su Visita  Tambin puede enviarnos un mensaje a travs de Pharmacist, community. Por lo general respondemos a los mensajes de MyChart en el transcurso de 1 a 2 das hbiles.  Para renovar recetas, por favor pida a su farmacia que se ponga en contacto con nuestra oficina. Harland Dingwall de fax es [place] [phone].  Si tiene un asunto urgente cuando la clnica est cerrada y que no puede esperar hasta el siguiente da hbil, puede llamar/localizar a su doctor(a) al nmero que aparece a continuacin.   Por favor, tenga en cuenta que aunque hacemos todo lo posible para estar disponibles para asuntos urgentes fuera del horario de [place], no estamos disponibles las 24 horas del da, los 7 das de la [place].   Si tiene un problema urgente y no puede comunicarse con nosotros, puede optar por buscar atencin mdica  en el consultorio de su doctor(a), en una clnica privada, en un centro de atencin urgente o en una sala de emergencias.  Si tiene Engineering geologist, por favor llame inmediatamente al 911 o vaya a la sala de emergencias.  Nmeros de bper  - Dr. Nehemiah Massed: [phone]  - Dra. Moye: [phone]  - Dra. Nicole Kindred: [phone]  En caso de inclemencias del [place], por favor llame a Johnsie Kindred principal al [phone] para una actualizacin sobre el [place] de cualquier retraso o cierre.  Consejos para la medicacin en dermatologa: Por favor, guarde las cajas en las que vienen los  medicamentos de uso tpico para ayudarle a seguir las instrucciones sobre dnde y cmo usarlos. Las farmacias generalmente imprimen las instrucciones del medicamento slo en las cajas y no directamente en los tubos del [place].   Si su medicamento es muy caro, por favor, pngase en contacto con Zigmund Daniel llamando al [phone] y presione la opcin 4 o envenos un mensaje a travs de Pharmacist, community.   No podemos decirle cul ser su copago por los medicamentos por adelantado ya que esto es diferente dependiendo de la cobertura de su seguro. Sin embargo, es posible que podamos encontrar un medicamento sustituto a Electrical engineer un formulario para que el seguro cubra el medicamento que se considera necesario.   Si se requiere una autorizacin previa para que su compaa de seguros [place] su medicamento, por favor permtanos de 1 a 2 das hbiles para completar este proceso.  Los precios de los medicamentos varan con frecuencia dependiendo del Environmental consultant de dnde se surte la receta y alguna farmacias pueden ofrecer precios ms baratos.  El sitio web www.goodrx.com tiene cupones  para medicamentos de Airline pilot. Los precios aqu no tienen en cuenta lo que podra costar con la ayuda del seguro (puede ser ms barato con su seguro), pero el sitio web puede darle el precio si no utiliz Research scientist (physical sciences).  - Puede imprimir el cupn correspondiente y llevarlo con su receta a la farmacia.  - Tambin puede pasar por nuestra oficina durante el horario de atencin regular y Charity fundraiser una tarjeta de cupones de GoodRx.  - Si necesita que su receta se enve electrnicamente a una farmacia diferente, informe a nuestra oficina a travs de MyChart de [place] o por telfono llamando al [phone] y presione la opcin 4.

## 2022-02-27 NOTE — Progress Notes (Signed)
Follow-Up Visit   Subjective  Mariah Price is a 51 y.o. female who presents for the following: Follow-up.  Patient here for 3 month follow-up HS of the right axilla and groin. She had a switch in insurance and was off Humira, but restarted last week. She has noticed improvement since injection last week. No injections, no injection site reactions. She is also using clindamycin lotion daily and CeraVe Foaming wash in the shower. She ran out of minocycline, so she restarted doxycycline that she already had at home.  She has not had any infections or side effects that she knows of from the Humira injections.   The following portions of the chart were reviewed this encounter and updated as appropriate:       Review of Systems:  No other skin or systemic complaints except as noted in HPI or Assessment and Plan.  Objective  Well appearing patient in no apparent distress; mood and affect are within normal limits.  A focused examination was performed including axilla. Relevant physical exam findings are noted in the Assessment and Plan.  Right Axilla; groin Firm sq nodules with ropey scarring, hyperpigmentation of the right axilla.    Assessment & Plan  Hidradenitis suppurativa Right Axilla; groin  Chronic and persistent condition with duration or expected duration over one year. Condition is symptomatic / bothersome to patient. Not to goal, but improving.  Pt flared off of Humira, but is doing much better once she was able to restart it.  Hidradenitis Suppurativa is a chronic; persistent; non-curable, but treatable condition due to abnormal inflamed sweat glands in the body folds (axilla, inframammary, groin, medial thighs), causing recurrent painful draining cysts and scarring. It can be associated with severe scarring acne and cysts; also abscesses and scarring of scalp. The goal is control and prevention of flares, as it is not curable. Scars are permanent and can be thickened. Treatment  may include daily use of topical medication and oral antibiotics.  Oral isotretinoin may also be helpful.  For more severe cases, Humira (a biologic injection) may be prescribed to decrease the inflammatory process and prevent flares.  When indicated, inflamed cysts may also be treated surgically.  Continue Humira 80mg  injections qowk. Patient tolerating well. No side effects. Reviewed risks of biologics including immunosuppression, infections, injection site reaction, and failure to improve condition. Goal is control of skin condition, not cure.  Some older biologics such as Humira and Enbrel may slightly increase risk of malignancy and may worsen congestive heart failure.  Talz and Cosentyx may cause inflammatory bowel disease to flare. The use of biologics requires long term medication management, including periodic office visits and monitoring of blood work.  Continue Clindamycin lotion Apply to AA QD after shower.  Continue CeraVe Foaming wash in the shower. Benzoyl peroxide can cause dryness and irritation of the skin. It can also bleach fabric. When used together with Aczone (dapsone) cream, it can stain the skin orange.  Finish remaining remaining doxycycline, then switch to minocycline 100mg  1 po BID prn flares dsp #60 3Rf Doxycycline should be taken with food to prevent nausea. Do not lay down for 30 minutes after taking. Be cautious with sun exposure and use good sun protection while on this medication. Pregnant women should not take this medication.    minocycline (MINOCIN) 100 MG capsule - Right Axilla; groin Take 1 capsule (100 mg total) by mouth 2 (two) times daily. Take with food and as needed for flares.  Related Medications clindamycin (CLEOCIN-T) 1 %  lotion Apply topically as directed. Qd to bid to aa axilla and groin   Return in about 6 months (around 08/29/2022) for HS.  Documentation: I have reviewed the above documentation for accuracy and completeness, and I agree with  the above.  Brendolyn Patty MD

## 2022-03-01 ENCOUNTER — Inpatient Hospital Stay: Admission: RE | Admit: 2022-03-01 | Payer: 59 | Source: Ambulatory Visit

## 2022-03-01 DIAGNOSIS — K635 Polyp of colon: Secondary | ICD-10-CM | POA: Diagnosis not present

## 2022-03-01 DIAGNOSIS — D125 Benign neoplasm of sigmoid colon: Secondary | ICD-10-CM | POA: Diagnosis not present

## 2022-03-01 DIAGNOSIS — K5730 Diverticulosis of large intestine without perforation or abscess without bleeding: Secondary | ICD-10-CM | POA: Diagnosis not present

## 2022-03-01 DIAGNOSIS — Z1211 Encounter for screening for malignant neoplasm of colon: Secondary | ICD-10-CM | POA: Diagnosis not present

## 2022-03-22 ENCOUNTER — Ambulatory Visit
Admission: RE | Admit: 2022-03-22 | Discharge: 2022-03-22 | Disposition: A | Discharge status: Home / Self Care | Payer: 59 | Source: Ambulatory Visit | Attending: Nurse Practitioner | Admitting: Nurse Practitioner

## 2022-03-22 DIAGNOSIS — Z720 Tobacco use: Secondary | ICD-10-CM

## 2022-03-22 DIAGNOSIS — F17200 Nicotine dependence, unspecified, uncomplicated: Secondary | ICD-10-CM

## 2022-03-22 DIAGNOSIS — R69 Illness, unspecified: Secondary | ICD-10-CM | POA: Diagnosis not present

## 2022-03-29 DIAGNOSIS — K581 Irritable bowel syndrome with constipation: Secondary | ICD-10-CM | POA: Diagnosis not present

## 2022-03-29 DIAGNOSIS — L732 Hidradenitis suppurativa: Secondary | ICD-10-CM | POA: Diagnosis not present

## 2022-03-29 DIAGNOSIS — K635 Polyp of colon: Secondary | ICD-10-CM | POA: Diagnosis not present

## 2022-03-29 DIAGNOSIS — Z720 Tobacco use: Secondary | ICD-10-CM | POA: Diagnosis not present

## 2022-03-29 DIAGNOSIS — I10 Essential (primary) hypertension: Secondary | ICD-10-CM | POA: Diagnosis not present

## 2022-04-02 DIAGNOSIS — M1990 Unspecified osteoarthritis, unspecified site: Secondary | ICD-10-CM | POA: Diagnosis not present

## 2022-04-02 DIAGNOSIS — H538 Other visual disturbances: Secondary | ICD-10-CM | POA: Diagnosis not present

## 2022-04-02 DIAGNOSIS — Z720 Tobacco use: Secondary | ICD-10-CM | POA: Diagnosis not present

## 2022-04-02 DIAGNOSIS — I10 Essential (primary) hypertension: Secondary | ICD-10-CM | POA: Diagnosis not present

## 2022-04-02 DIAGNOSIS — J45909 Unspecified asthma, uncomplicated: Secondary | ICD-10-CM | POA: Diagnosis not present

## 2022-04-02 DIAGNOSIS — L732 Hidradenitis suppurativa: Secondary | ICD-10-CM | POA: Diagnosis not present

## 2022-04-02 DIAGNOSIS — R69 Illness, unspecified: Secondary | ICD-10-CM | POA: Diagnosis not present

## 2022-04-02 DIAGNOSIS — E669 Obesity, unspecified: Secondary | ICD-10-CM | POA: Diagnosis not present

## 2022-04-02 DIAGNOSIS — K5900 Constipation, unspecified: Secondary | ICD-10-CM | POA: Diagnosis not present

## 2022-04-02 DIAGNOSIS — E119 Type 2 diabetes mellitus without complications: Secondary | ICD-10-CM | POA: Diagnosis not present

## 2022-04-02 DIAGNOSIS — Z6833 Body mass index (BMI) 33.0-33.9, adult: Secondary | ICD-10-CM | POA: Diagnosis not present

## 2022-04-25 DIAGNOSIS — J449 Chronic obstructive pulmonary disease, unspecified: Secondary | ICD-10-CM | POA: Diagnosis not present

## 2022-04-25 DIAGNOSIS — Z20828 Contact with and (suspected) exposure to other viral communicable diseases: Secondary | ICD-10-CM | POA: Diagnosis not present

## 2022-04-25 DIAGNOSIS — J069 Acute upper respiratory infection, unspecified: Secondary | ICD-10-CM | POA: Diagnosis not present

## 2022-04-26 DIAGNOSIS — J069 Acute upper respiratory infection, unspecified: Secondary | ICD-10-CM | POA: Diagnosis not present

## 2022-04-26 DIAGNOSIS — Z716 Tobacco abuse counseling: Secondary | ICD-10-CM | POA: Diagnosis not present

## 2022-05-16 ENCOUNTER — Other Ambulatory Visit: Payer: Self-pay

## 2022-05-16 MED ORDER — HUMIRA (2 PEN) 80 MG/0.8ML ~~LOC~~ PNKT: 80.0000 mg | PEN_INJECTOR | SUBCUTANEOUS | 2 refills | Status: AC

## 2022-05-16 NOTE — Progress Notes (Signed)
Refill request faxed from [facility]  

## 2022-09-23 ENCOUNTER — Ambulatory Visit: Payer: 59 | Admitting: Dermatology

## 2022-10-04 ENCOUNTER — Ambulatory Visit: Payer: Self-pay | Admitting: Cardiology

## 2022-11-17 IMAGING — CR DG HIP (WITH OR WITHOUT PELVIS) 2-3V*R*
3 series · 3 of 3 positions shown · non-contrast
Comparison: Lumbar radiographs today.

CLINICAL DATA: 50-year-old female with right low back pain
radiating to the hip and down the leg for 1 week.

EXAM:
DG HIP (WITH OR WITHOUT PELVIS) 2-3V RIGHT

[t pelvis ap]
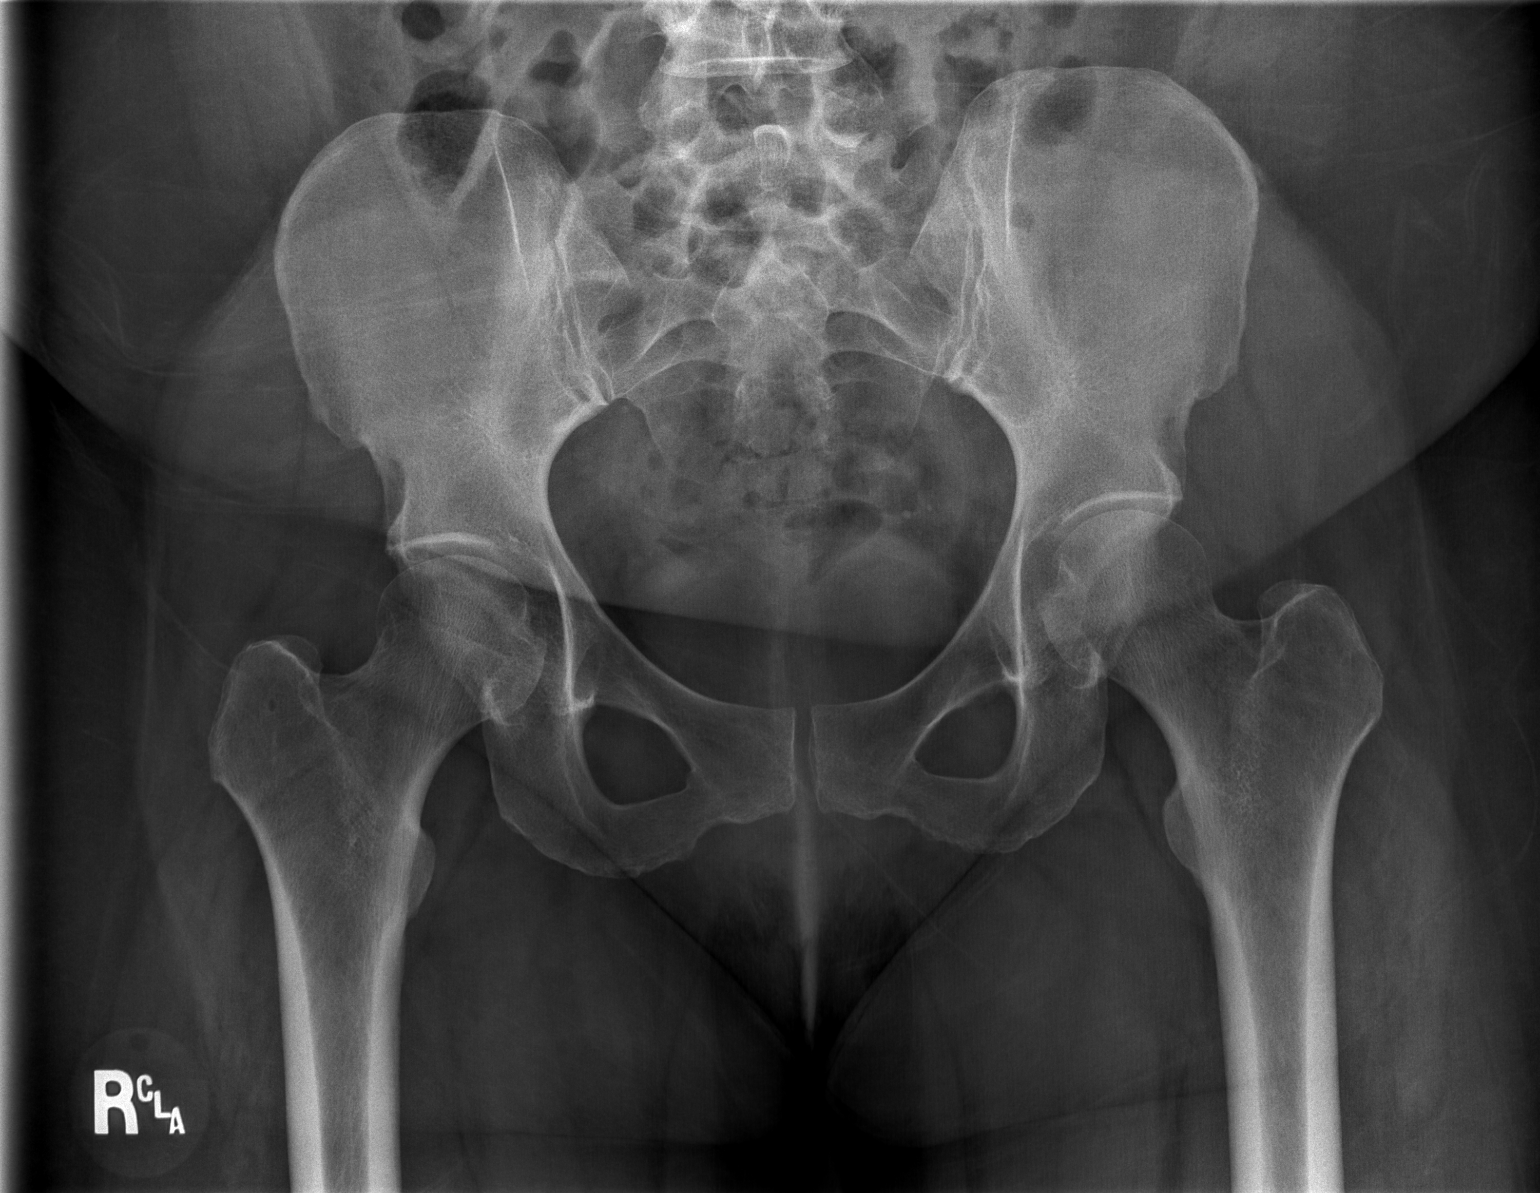

[t hip ap right]
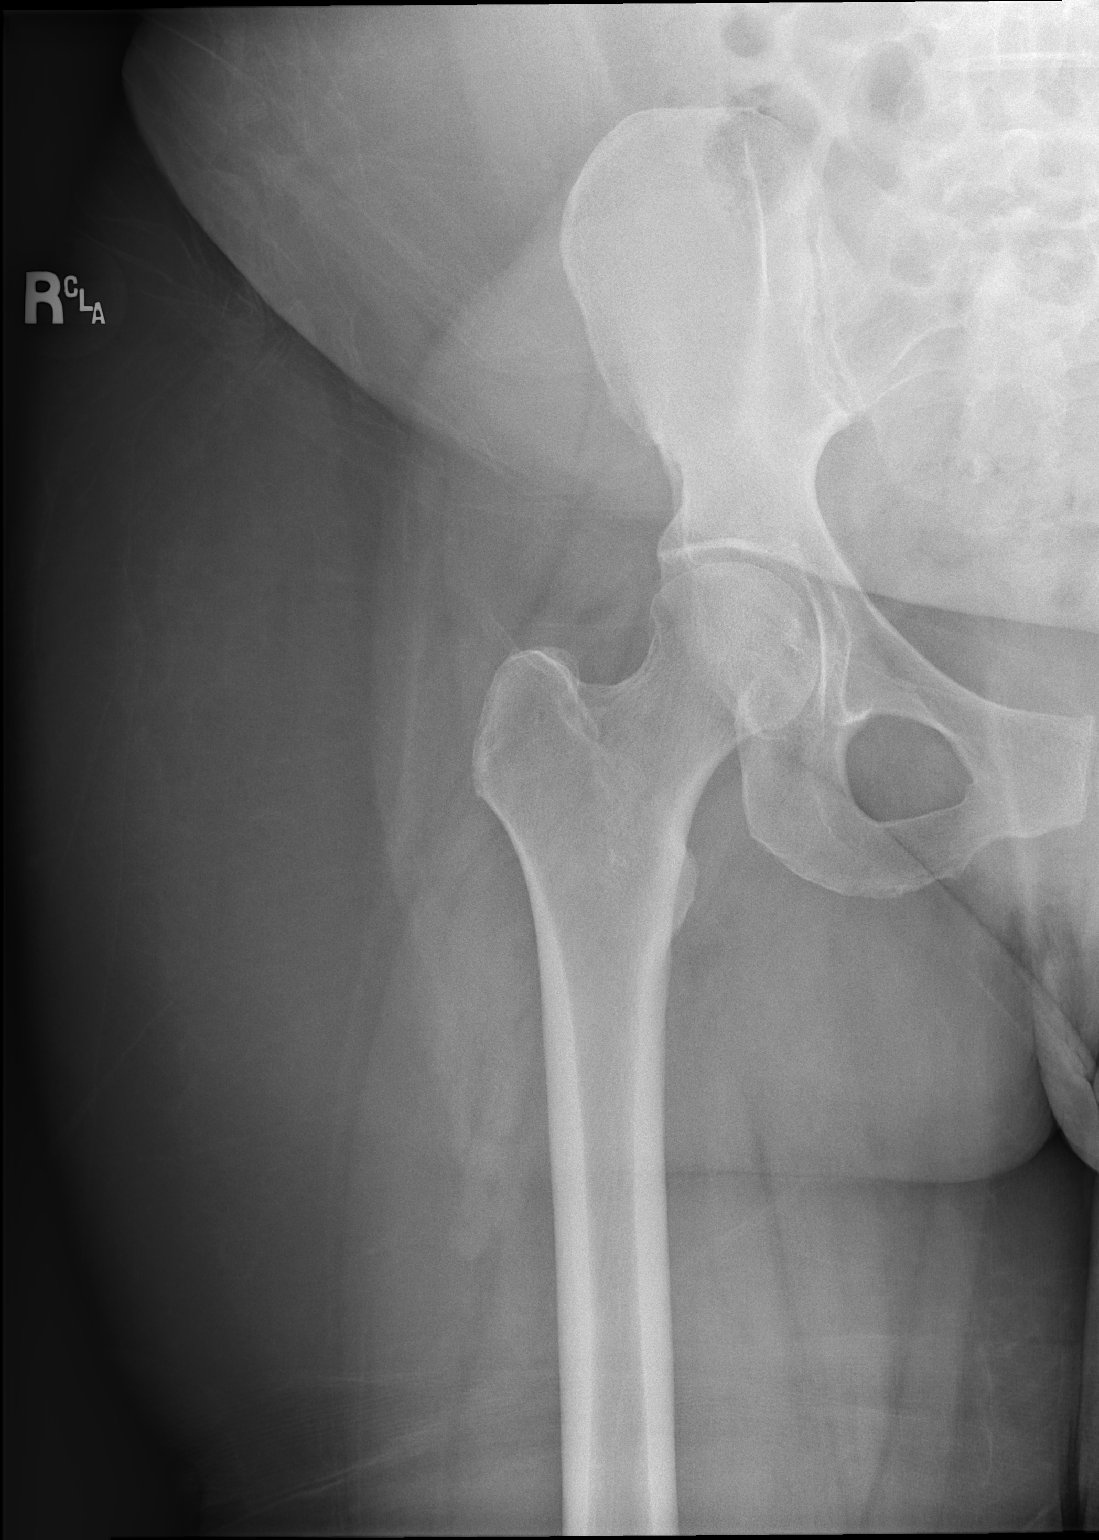

[t hip frog leg right]
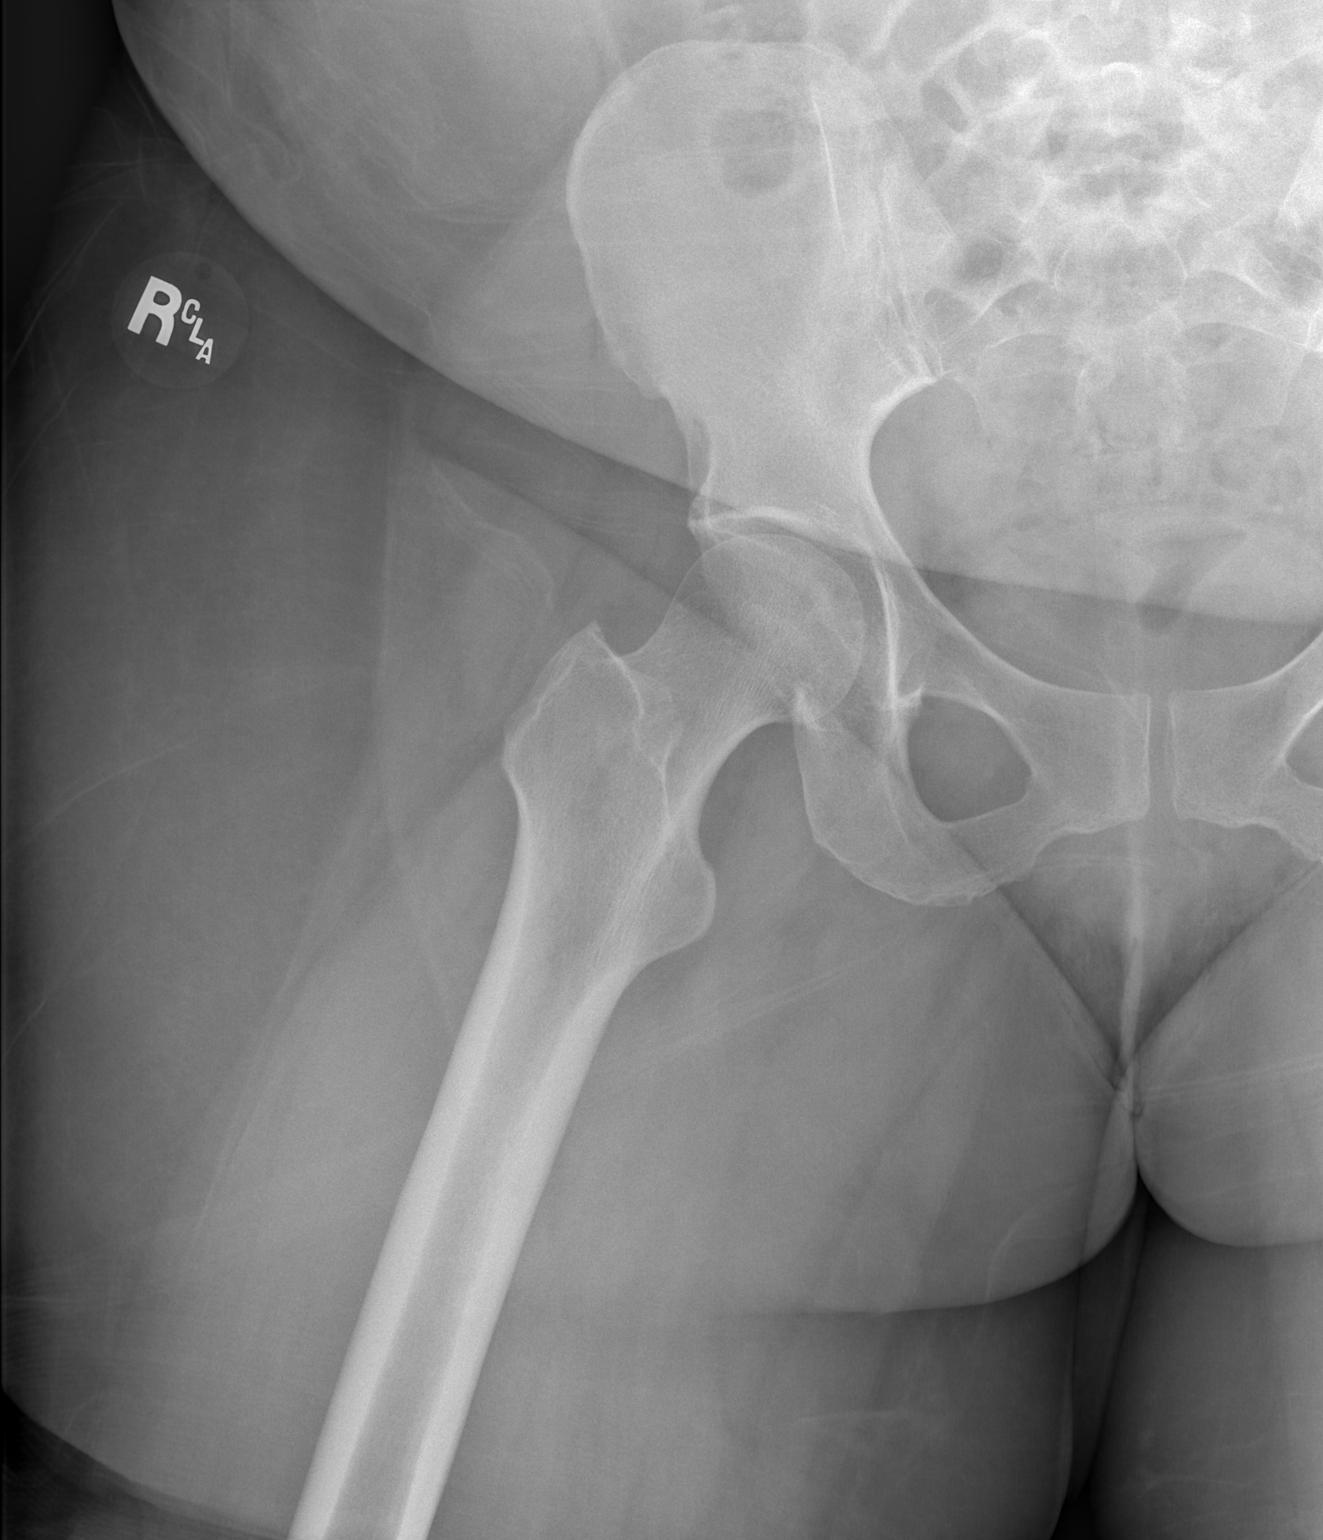

[3 of 3 positions shown; findings below may reference images not displayed]

FINDINGS: Bone mineralization is within normal limits. Hip joint spaces appear
symmetric and within normal limits. SI joints appear symmetric and
normal. Grossly intact proximal left femur. Proximal right femur
appears normal. No acute osseous abnormality identified. Negative
visible lower abdominal and pelvic visceral contours.
IMPRESSION: Negative.

## 2022-11-17 IMAGING — CR DG LUMBAR SPINE COMPLETE 4+V
5 series · 5 of 5 positions shown · non-contrast
Comparison: None.

CLINICAL DATA: 50-year-old female with right low back pain
radiating to the hip and down the leg for 1 week.

EXAM:
LUMBAR SPINE - COMPLETE 4+ VIEW

[t lumbar spine ap]
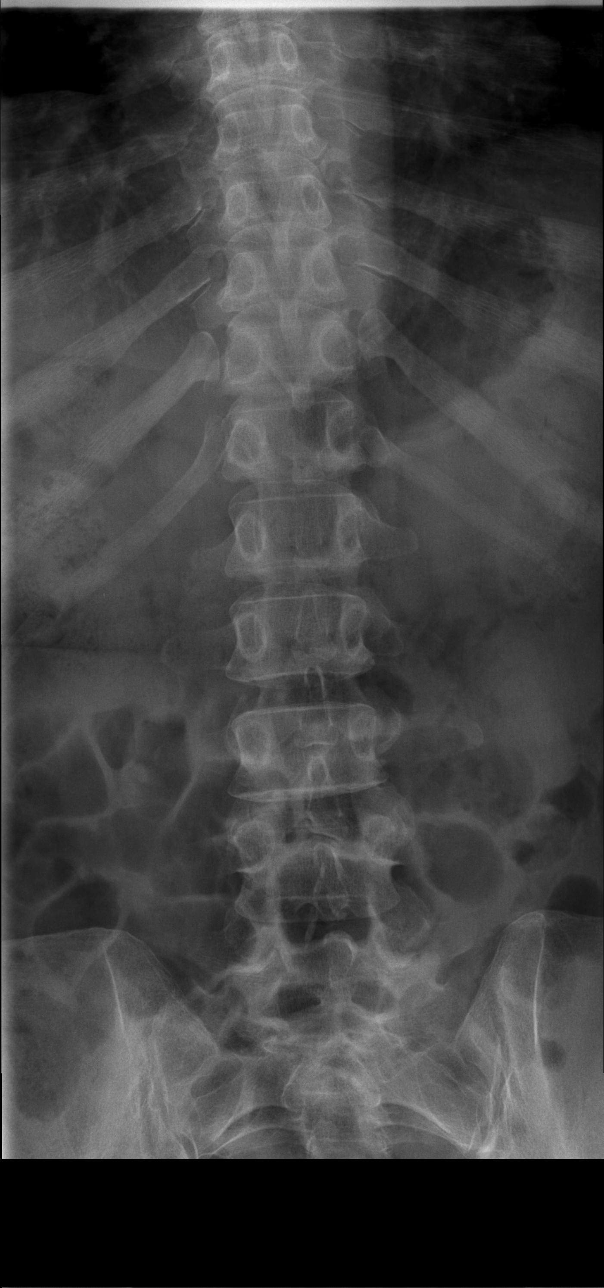

[t lumbar spine obl (1 of 2)]
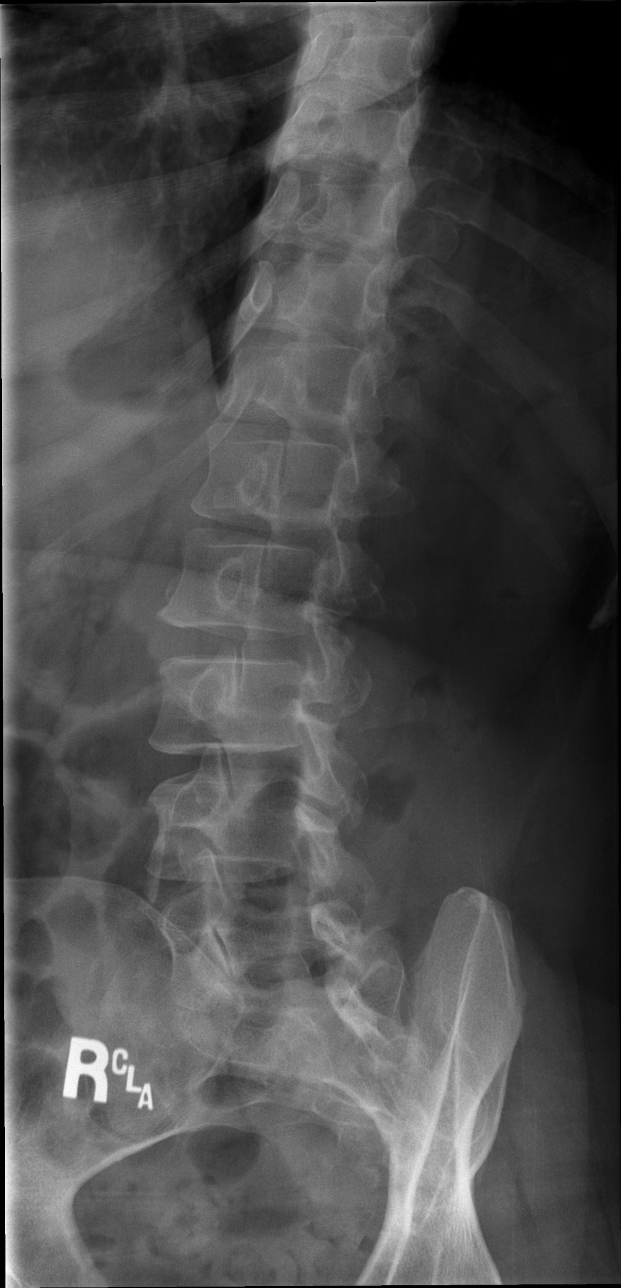

[t lumbar spine obl (2 of 2)]
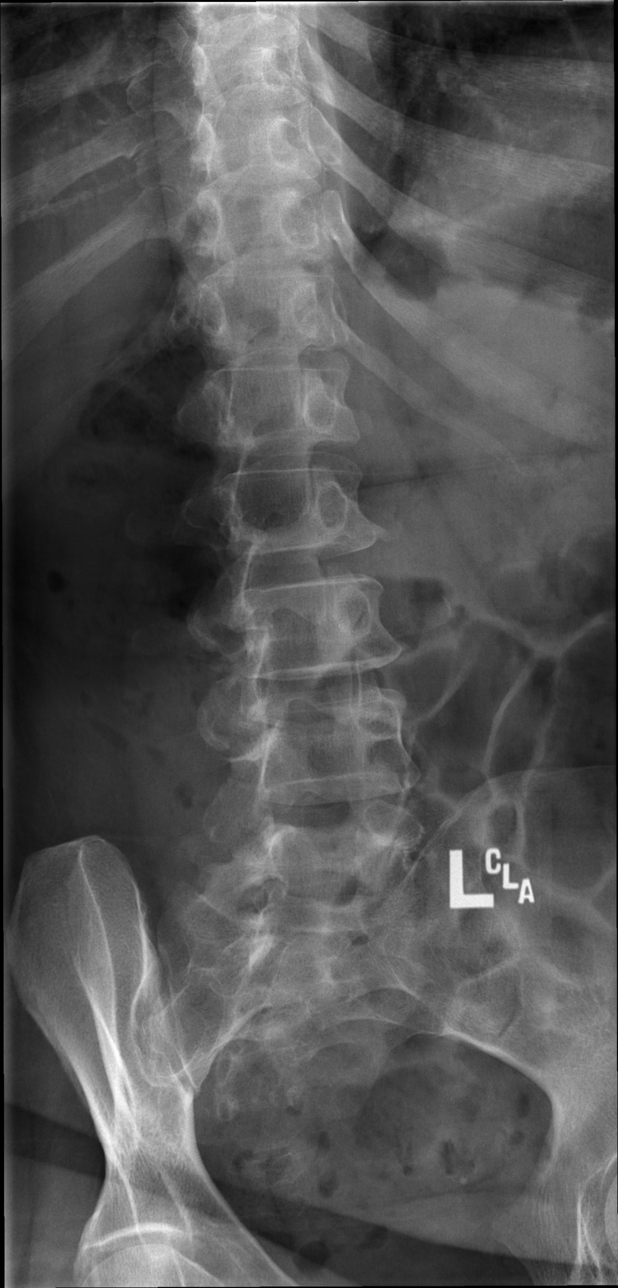

[t lumbar spine lat]
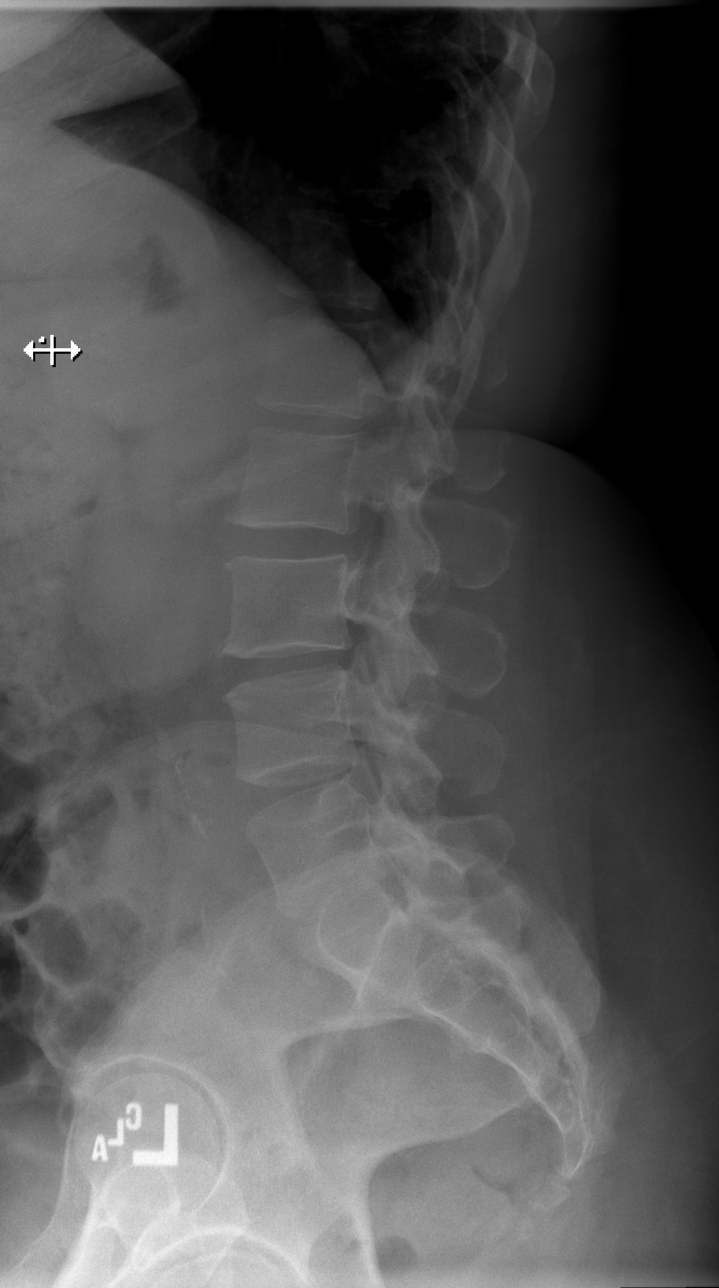

[t lumbar l-5 s-1 spot]
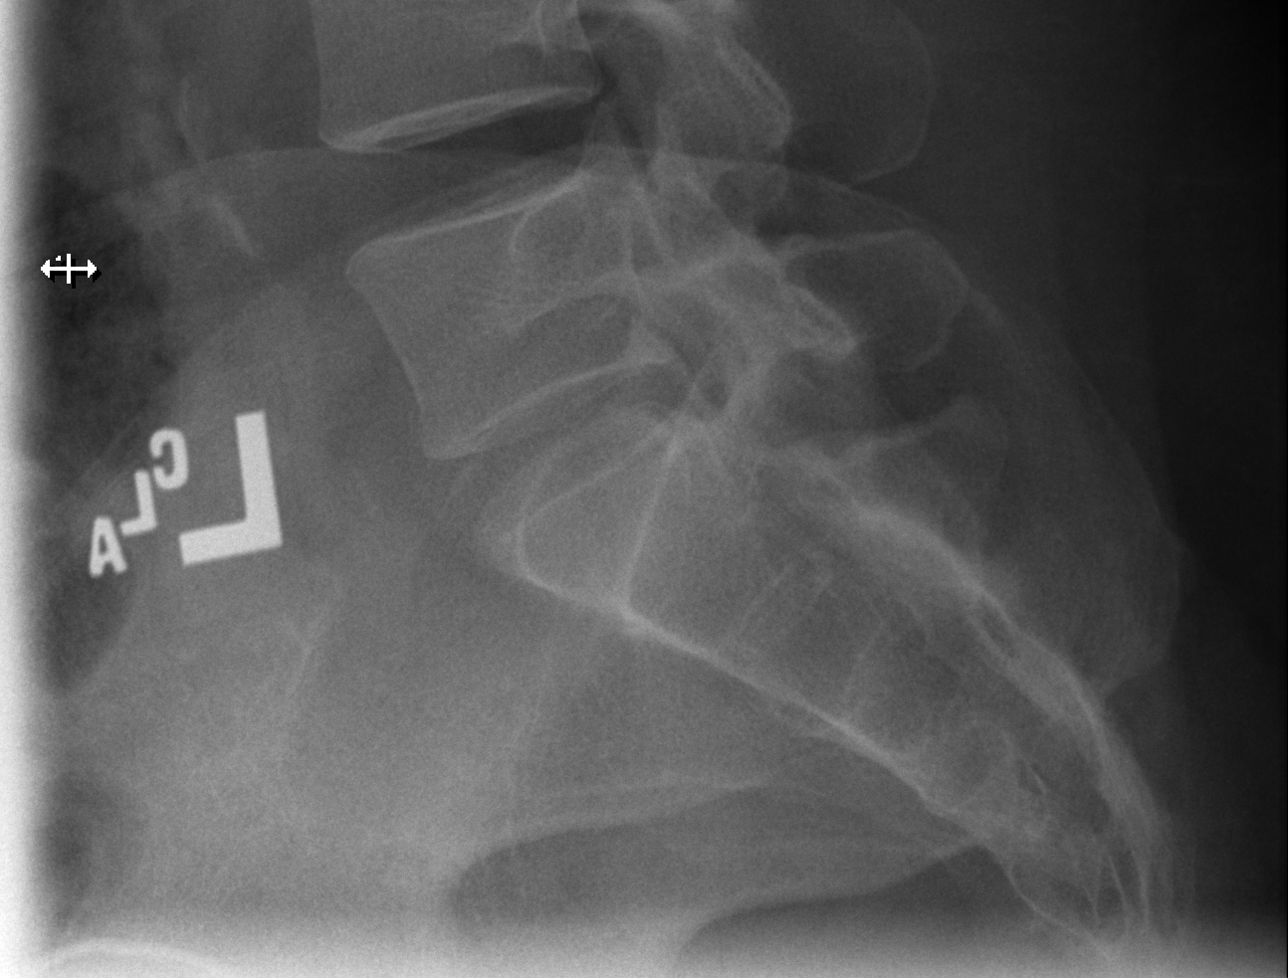

[5 of 5 positions shown; findings below may reference images not displayed]

FINDINGS: Normal lumbar segmentation. Mild straightening of lumbar lordosis.
No spondylolisthesis. No significant scoliosis. No pars fracture. No
acute osseous abnormality identified. Visible sacrum and SI joints
appear intact.

Relatively preserved disc spaces. Mild to moderate degenerative
endplate spurring at L3-L4. Mild endplate spurring at L2-L3.

Mild Calcified aortic atherosclerosis. Negative visible bowel gas
pattern.
IMPRESSION: 1. No acute osseous abnormality identified in the lumbar spine.
2. Disc spaces relatively preserved. Degenerative endplate spurring
at L3-L4 and L2-L3.

## 2023-01-03 ENCOUNTER — Emergency Department (HOSPITAL_COMMUNITY): Payer: BLUE CROSS/BLUE SHIELD

## 2023-01-03 ENCOUNTER — Other Ambulatory Visit: Payer: Self-pay

## 2023-01-03 ENCOUNTER — Emergency Department (HOSPITAL_COMMUNITY)
Admission: EM | Admit: 2023-01-03 | Discharge: 2023-01-03 | Discharge status: Against Medical Advice | Payer: BLUE CROSS/BLUE SHIELD | Attending: Emergency Medicine | Admitting: Emergency Medicine

## 2023-01-03 DIAGNOSIS — J449 Chronic obstructive pulmonary disease, unspecified: Secondary | ICD-10-CM | POA: Diagnosis not present

## 2023-01-03 DIAGNOSIS — L02411 Cutaneous abscess of right axilla: Secondary | ICD-10-CM

## 2023-01-03 DIAGNOSIS — I509 Heart failure, unspecified: Secondary | ICD-10-CM | POA: Insufficient documentation

## 2023-01-03 DIAGNOSIS — Z79899 Other long term (current) drug therapy: Secondary | ICD-10-CM | POA: Diagnosis not present

## 2023-01-03 DIAGNOSIS — I110 Hypertensive heart disease with heart failure: Secondary | ICD-10-CM | POA: Insufficient documentation

## 2023-01-03 DIAGNOSIS — M25471 Effusion, right ankle: Secondary | ICD-10-CM

## 2023-01-03 DIAGNOSIS — Z7951 Long term (current) use of inhaled steroids: Secondary | ICD-10-CM | POA: Insufficient documentation

## 2023-01-03 DIAGNOSIS — M25571 Pain in right ankle and joints of right foot: Secondary | ICD-10-CM | POA: Insufficient documentation

## 2023-01-03 DIAGNOSIS — M7989 Other specified soft tissue disorders: Secondary | ICD-10-CM | POA: Diagnosis present

## 2023-01-03 NOTE — ED Provider Notes (Signed)
[place] EMERGENCY DEPARTMENT AT [facility] Provider Note   CSN: [id number] Arrival date & time: 01/03/23  1211     History  Chief Complaint  Patient presents with   Foot Pain   Abscess    Mariah Price is a 52 y.o. female.  The history is provided by the patient and medical records. No language interpreter was used.  Foot Pain  Abscess    52 yo female with history of CHF, COPD, HTN, and arthritis presents to the ED today with R medial ankle pain x 3 weeks. Onset of pain was abrupt. Pain is constant and radiates into her right leg. Denies history of DVT or any recent long trips. 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 Denies pain on the left leg or foot. Denies redness, warmth, but endorses significant swelling. Notes that her ankles R ankle typically swells after being on her feet for long periods of time, but notes that it is not usually as swollen as it is today. Denies any trauma, injury, or skin abrasion to the area. Has tried ibuprofen, Voltaren gel, Naproxen, and Tylenol without symptom relief. Pain is aggravated by movement and standing for long periods of time.  Additionally, she complains of an abscess in her right axillae. Hx of HS and is followed by dermatology. She notes that it has been inflamed for the past week and is painful. She has pain with ROM but denies radiation down her arm or into her chest. Denies drainage. Denies fever and chills. She has not tried anything to alleviate her symptoms. Has not been on any recent courses of antibiotics.   Home Medications Prior to Admission medications   Medication Sig Start Date End Date Taking? Authorizing Provider  Adalimumab (HUMIRA PEN) 80 MG/0.8ML PNKT Inject 80 mg into the skin every 14 (fourteen) days. Starting on day 29. 05/16/22   Willeen Niece, MD  Adalimumab [facility] PEN-CD/UC/HS STARTER) 80 MG/0.8ML PNKT Inject 160 mg into the skin as directed. Inject 160 mg  subcutaneous on day 1, then 80 mg subcutaneous on day 15. 10/16/21   Willeen Niece, MD  budesonide-formoterol [facility]) 160-4.5 MCG/ACT inhaler Inhale into the lungs. 11/17/19   [provider]  buPROPion (WELLBUTRIN XL) 150 MG 24 hr tablet Take 1 tablet by mouth daily. 11/17/19   [provider]  clindamycin (CLEOCIN-T) 1 % lotion Apply topically as directed. Qd to bid to aa axilla and groin 02/27/22 02/27/23  Willeen Niece, MD  escitalopram (LEXAPRO) 10 MG tablet Take 10 mg by mouth daily. 08/06/21   [provider]  famotidine (PEPCID) 10 MG tablet Take 1 tablet by mouth as needed. 11/17/19   [provider]  hydrochlorothiazide (HYDRODIURIL) 12.5 MG tablet Take 12.5 mg by mouth every morning. 08/06/21   [provider]  hydrOXYzine (ATARAX) 25 MG tablet Take 25 mg by mouth 4 (four) times daily. 05/28/21   [provider]  ibuprofen (ADVIL) 400 MG tablet Take 1 tablet (400 mg total) by mouth every 6 (six) hours as needed. 09/14/20   Lorre Nick, MD  ipratropium (ATROVENT HFA) 17 MCG/ACT inhaler Inhale into the lungs. 11/17/19   [provider]  lidocaine (LIDODERM) 5 % Place 1 patch onto the skin daily. Remove & Discard patch within 12 hours or as directed by MD 09/14/20   Lorre Nick, MD  linaclotide [facility]) 290 MCG CAPS capsule Take 1 capsule by mouth as needed. 11/17/19  [provider]  losartan (COZAAR) 100 MG tablet Take 100 mg by mouth daily. 05/28/21   [provider]  methocarbamol (ROBAXIN-750) 750 MG tablet Take 1 tablet (750 mg total) by mouth 4 (four) times daily. 09/14/20   Lorre Nick, MD  minocycline (MINOCIN) 100 MG capsule Take 1 capsule (100 mg total) by mouth 2 (two) times daily. Take with food and as needed for flares. 02/27/22   Willeen Niece, MD  ondansetron (ZOFRAN-ODT) 4 MG disintegrating tablet Take 1 tablet (4 mg total) by mouth every 8 (eight) hours as needed for nausea or vomiting. 01/28/22    Long, Arlyss Repress, MD  spironolactone (ALDACTONE) 25 MG tablet Take 25 mg by mouth every other day. 05/28/21   [provider]      Allergies    Sulfa antibiotics    Review of Systems   Review of Systems  All other systems reviewed and are negative.   Physical Exam Updated Vital Signs BP 125/89 (BP Location: Right Arm)   Pulse 75   Temp 98.6 F (37 C) (Oral)   Resp 16   Ht 5\' 3"  (1.6 m)   Wt 87 kg   SpO2 100%   BMI 33.98 kg/m  Physical Exam Vitals and nursing note reviewed.  Constitutional:      General: She is not in acute distress.    Appearance: She is well-developed.  HENT:     Head: Atraumatic.  Eyes:     Conjunctiva/sclera: Conjunctivae normal.  Pulmonary:     Effort: Pulmonary effort is normal.  Musculoskeletal:        General: Swelling (R ankle: mild tenderness noted to medial malleolar region without erythema or warmth.  no calf tenderness, DP 2+) present.     Cervical back: Neck supple.  Skin:    Findings: No rash.     Comments: R axillary fold: small subcutaneous nodule palpated mildly tender without surrounding erythema  Neurological:     Mental Status: She is alert.  Psychiatric:        Mood and Affect: Mood normal.     ED Results / Procedures / Treatments   Labs (all labs ordered are listed, but only abnormal results are displayed) Labs Reviewed - No data to display  EKG None  Radiology DG Ankle Complete Right  Result Date: 01/03/2023 CLINICAL DATA:  Right foot pain/swelling x2 weeks EXAM: RIGHT ANKLE - COMPLETE 3+ VIEW COMPARISON:  None Available. FINDINGS: No fracture or dislocation is seen. The ankle mortise is intact. The base of the fifth metatarsal is unremarkable. Mild medial soft tissue swelling. IMPRESSION: No fracture or dislocation is seen. Mild medial soft tissue swelling. Electronically Signed   By: Charline Bills M.D.   On: 01/03/2023 17:09    Procedures Procedures    Medications Ordered in ED Medications - No data to  display  ED Course/ Medical Decision Making/ A&P                                 Medical Decision Making  BP 125/89 (BP Location: Right Arm)   Pulse 75   Temp 98.6 F (37 C) (Oral)   Resp 16   Ht 5\' 3"  (1.6 m)   Wt 87 kg   SpO2 100%   BMI 33.98 kg/m   5:17 PM  52 yo female with history of CHF, COPD, HTN, and arthritis presents to the ED today with R medial ankle  pain x 3 weeks. Onset of pain was abrupt. Pain is constant and radiates into her right leg. Denies history of DVT or any recent long trips. 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 Denies pain on the left leg or foot. Denies redness, warmth, but endorses significant swelling. Notes that her ankles R ankle typically swells after being on her feet for long periods of time, but notes that it is not usually as swollen as it is today. Denies any trauma, injury, or skin abrasion to the area. Has tried ibuprofen, Voltaren gel, Naproxen, and Tylenol without symptom relief. Pain is aggravated by movement and standing for long periods of time.  Additionally, she complains of an abscess in her right axillae. Hx of HS and is followed by dermatology. She notes that it has been inflamed for the past week and is painful. She has pain with ROM but denies radiation down her arm or into her chest. Denies drainage. Denies fever and chills. She has not tried anything to alleviate her symptoms. Has not been on any recent courses of antibiotics.   Unfortunately patient has eloped prior to right ankle x-ray resulting.  However, low suspicion for DVT causing right lower extremity swelling or pain.  Patient has history of at bedtime and therefore she would likely benefit from antibiotic for her subcutaneous abscess in her right axillary region.  I was planning on prescribing doxycycline to cover for infection but patient has eloped prior to providing additional care.        Final Clinical Impression(s) /  ED Diagnoses Final diagnoses:  Abscess of right axilla  Right ankle swelling    Rx / DC Orders ED Discharge Orders     None         Fayrene Helper, PA-C 01/03/23 1719    Vanetta Mulders, MD 01/06/23 1515

## 2023-01-03 NOTE — ED Notes (Signed)
Pt left AMA because she was waiting to long.

## 2023-01-03 NOTE — ED Triage Notes (Signed)
Pt reports right foot pain and swelling x 2 weeks, denies injury. Also c/o abscess on right axilla "flares up sometimes"

## 2023-02-11 ENCOUNTER — Other Ambulatory Visit: Payer: Self-pay

## 2023-02-11 ENCOUNTER — Other Ambulatory Visit (HOSPITAL_COMMUNITY): Payer: Self-pay

## 2023-02-11 MED ORDER — HUMIRA-CD/UC/HS STARTER 80 MG/0.8ML ~~LOC~~ AJKT
AUTO-INJECTOR | SUBCUTANEOUS | 0 refills | Status: DC
  Filled 2023-02-12 – 2023-02-13 (×2): qty 2.4, 28d supply, fill #0

## 2023-02-11 MED ORDER — HUMIRA (2 PEN) 80 MG/0.8ML ~~LOC~~ PNKT
PEN_INJECTOR | SUBCUTANEOUS | 5 refills | Status: AC
  Filled 2023-03-13: qty 2, 28d supply, fill #0

## 2023-02-12 ENCOUNTER — Other Ambulatory Visit (HOSPITAL_COMMUNITY): Payer: Self-pay

## 2023-02-12 ENCOUNTER — Other Ambulatory Visit: Payer: Self-pay

## 2023-02-13 ENCOUNTER — Other Ambulatory Visit: Payer: Self-pay

## 2023-02-13 ENCOUNTER — Other Ambulatory Visit (HOSPITAL_COMMUNITY): Payer: Self-pay

## 2023-02-14 ENCOUNTER — Other Ambulatory Visit (HOSPITAL_COMMUNITY): Payer: Self-pay

## 2023-02-15 ENCOUNTER — Encounter (HOSPITAL_COMMUNITY): Payer: Self-pay

## 2023-03-05 ENCOUNTER — Other Ambulatory Visit: Payer: Self-pay

## 2023-03-07 ENCOUNTER — Other Ambulatory Visit: Payer: Self-pay

## 2023-03-13 ENCOUNTER — Other Ambulatory Visit: Payer: Self-pay

## 2023-03-13 ENCOUNTER — Other Ambulatory Visit (HOSPITAL_COMMUNITY): Payer: Self-pay

## 2023-03-13 NOTE — Progress Notes (Signed)
Specialty Pharmacy Refill Coordination Note  Mariah Price is a 52 y.o. female contacted today regarding refills of specialty medication(s) Adalimumab   Patient requested [name] at [facility] at [place] date: 03/14/23   Medication will be filled on 03/14/23.

## 2023-03-14 ENCOUNTER — Other Ambulatory Visit: Payer: Self-pay

## 2023-03-18 ENCOUNTER — Other Ambulatory Visit (HOSPITAL_COMMUNITY): Payer: Self-pay

## 2023-04-02 ENCOUNTER — Other Ambulatory Visit: Payer: Self-pay

## 2023-04-04 ENCOUNTER — Other Ambulatory Visit: Payer: Self-pay

## 2023-04-07 ENCOUNTER — Other Ambulatory Visit: Payer: Self-pay

## 2023-04-07 MED ORDER — HUMIRA (2 PEN) 80 MG/0.8ML ~~LOC~~ AJKT
AUTO-INJECTOR | SUBCUTANEOUS | 3 refills | Status: AC
  Filled 2023-04-07: qty 2, 28d supply, fill #0
  Filled 2023-05-01: qty 2, 28d supply, fill #1
  Filled 2023-05-27: qty 2, 28d supply, fill #2

## 2023-04-07 NOTE — Progress Notes (Signed)
Specialty Pharmacy Refill Coordination Note  Mariah Price is a 52 y.o. female contacted today regarding refills of specialty medication(s) Adalimumab   Patient requested [name] at [facility] at [place] date: 04/09/23   Medication will be filled on 04/08/23.

## 2023-04-08 ENCOUNTER — Other Ambulatory Visit: Payer: Self-pay

## 2023-04-11 ENCOUNTER — Other Ambulatory Visit (HOSPITAL_COMMUNITY): Payer: Self-pay

## 2023-04-20 ENCOUNTER — Emergency Department (HOSPITAL_BASED_OUTPATIENT_CLINIC_OR_DEPARTMENT_OTHER)
Admission: EM | Admit: 2023-04-20 | Discharge: 2023-04-20 | Disposition: A | Discharge status: Home / Self Care | Payer: BLUE CROSS/BLUE SHIELD | Attending: Emergency Medicine | Admitting: Emergency Medicine

## 2023-04-20 ENCOUNTER — Encounter (HOSPITAL_BASED_OUTPATIENT_CLINIC_OR_DEPARTMENT_OTHER): Payer: Self-pay

## 2023-04-20 ENCOUNTER — Other Ambulatory Visit: Payer: Self-pay

## 2023-04-20 DIAGNOSIS — N3000 Acute cystitis without hematuria: Secondary | ICD-10-CM | POA: Insufficient documentation

## 2023-04-20 DIAGNOSIS — R42 Dizziness and giddiness: Secondary | ICD-10-CM | POA: Diagnosis present

## 2023-04-20 DIAGNOSIS — J45909 Unspecified asthma, uncomplicated: Secondary | ICD-10-CM | POA: Insufficient documentation

## 2023-04-20 DIAGNOSIS — Z1152 Encounter for screening for COVID-19: Secondary | ICD-10-CM | POA: Diagnosis not present

## 2023-04-20 DIAGNOSIS — J449 Chronic obstructive pulmonary disease, unspecified: Secondary | ICD-10-CM | POA: Insufficient documentation

## 2023-04-20 DIAGNOSIS — Z79899 Other long term (current) drug therapy: Secondary | ICD-10-CM | POA: Insufficient documentation

## 2023-04-20 DIAGNOSIS — I110 Hypertensive heart disease with heart failure: Secondary | ICD-10-CM | POA: Insufficient documentation

## 2023-04-20 DIAGNOSIS — I509 Heart failure, unspecified: Secondary | ICD-10-CM | POA: Diagnosis not present

## 2023-04-20 LAB — URINALYSIS, ROUTINE W REFLEX MICROSCOPIC
Bilirubin Urine: NEGATIVE
Glucose, UA: NEGATIVE mg/dL
Nitrite: NEGATIVE
Specific Gravity, Urine: 1.037 — ABNORMAL HIGH (ref 1.005–1.030)
pH: 5.5 (ref 5.0–8.0)

## 2023-04-20 LAB — CBC
HCT: 46.3 % — ABNORMAL HIGH (ref 36.0–46.0)
Hemoglobin: 15.1 g/dL — ABNORMAL HIGH (ref 12.0–15.0)
MCH: 25 pg — ABNORMAL LOW (ref 26.0–34.0)
MCHC: 32.6 g/dL (ref 30.0–36.0)
MCV: 76.7 fL — ABNORMAL LOW (ref 80.0–100.0)
Platelets: 253 10*3/uL (ref 150–400)
RBC: 6.04 MIL/uL — ABNORMAL HIGH (ref 3.87–5.11)
RDW: 14.2 % (ref 11.5–15.5)
WBC: 6.3 10*3/uL (ref 4.0–10.5)
nRBC: 0 % (ref 0.0–0.2)

## 2023-04-20 LAB — BASIC METABOLIC PANEL
Anion gap: 7 (ref 5–15)
BUN: 14 mg/dL (ref 6–20)
CO2: 28 mmol/L (ref 22–32)
Calcium: 9.6 mg/dL (ref 8.9–10.3)
Chloride: 101 mmol/L (ref 98–111)
Creatinine, Ser: 0.67 mg/dL (ref 0.44–1.00)
GFR, Estimated: >60 mL/min (ref >60)
Glucose, Bld: 97 mg/dL (ref 70–99)
Potassium: 4 mmol/L (ref 3.5–5.1)
Sodium: 136 mmol/L (ref 135–145)

## 2023-04-20 LAB — RESP PANEL BY RT-PCR (RSV, FLU A&B, COVID)  RVPGX2
Influenza A by PCR: NEGATIVE
Influenza B by PCR: NEGATIVE
Resp Syncytial Virus by PCR: NEGATIVE
SARS Coronavirus 2 by RT PCR: NEGATIVE

## 2023-04-20 LAB — TROPONIN I (HIGH SENSITIVITY)
Troponin I (High Sensitivity): 2 ng/L (ref <18)
Troponin I (High Sensitivity): 2 ng/L (ref <18)

## 2023-04-20 LAB — CBG MONITORING, ED: Glucose-Capillary: 89 mg/dL (ref 70–99)

## 2023-04-20 MED ORDER — CEPHALEXIN 500 MG PO CAPS: 500.0000 mg | ORAL_CAPSULE | Freq: Two times a day (BID) | ORAL | 0 refills | Status: AC

## 2023-04-20 NOTE — ED Notes (Signed)
Pt unable to provide a urine sample at this time.

## 2023-04-20 NOTE — ED Provider Notes (Signed)
[place] EMERGENCY DEPARTMENT AT [facility] Provider Note   CSN: [id number] Arrival date & time: 04/20/23  1042     History  Chief Complaint  Patient presents with   Dizziness    Mariah Price is a 52 y.o. female.  Patient here with some painful urination, some lightheadedness.  Denies any chest pain weakness numbness tingling vision loss.  Nothing makes it worse or better.  History of COPD CHF pancreatitis asthma.  Denies any flank pain nausea vomiting diarrhea.  Denies any speech changes.  The history is provided by the patient.       Home Medications Prior to Admission medications   Medication Sig Start Date End Date Taking? Authorizing Provider  cephALEXin (KEFLEX) 500 MG capsule Take 1 capsule (500 mg total) by mouth 2 (two) times daily for 5 days. 04/20/23 04/25/23 Yes Kaitlyn Franko, DO  Adalimumab (HUMIRA PEN) 80 MG/0.8ML PNKT Inject 80 mg into the skin every 14 (fourteen) days. Starting on day 29. 05/16/22   Willeen Niece, MD  Adalimumab [facility] PEN-CD/UC/HS STARTER) 80 MG/0.8ML PNKT Inject 160 mg into the skin as directed. Inject 160 mg subcutaneous on day 1, then 80 mg subcutaneous on day 15. 10/16/21   Willeen Niece, MD  adalimumab (HUMIRA, 2 PEN,) 80 MG/0.8ML pen Inject 0.8 mL (80 mg total) under the skin every 14 (fourteen) days. 02/11/23     adalimumab (HUMIRA, 2 PEN,) 80 MG/0.8ML pen Inject 0.13mL (80 mg total) under the skin every 14 (fourteen) days. 02/11/23     budesonide-formoterol (SYMBICORT) 160-4.5 MCG/ACT inhaler Inhale into the lungs. 11/17/19   [provider]  buPROPion (WELLBUTRIN XL) 150 MG 24 hr tablet Take 1 tablet by mouth daily. 11/17/19   [provider]  escitalopram (LEXAPRO) 10 MG tablet Take 10 mg by mouth daily. 08/06/21   [provider]  famotidine (PEPCID) 10 MG tablet Take 1 tablet by mouth as needed. 11/17/19   [provider]  hydrochlorothiazide (HYDRODIURIL) 12.5 MG tablet Take 12.5 mg by mouth  every morning. 08/06/21   [provider]  hydrOXYzine (ATARAX) 25 MG tablet Take 25 mg by mouth 4 (four) times daily. 05/28/21   [provider]  ibuprofen (ADVIL) 400 MG tablet Take 1 tablet (400 mg total) by mouth every 6 (six) hours as needed. 09/14/20   Lorre Nick, MD  ipratropium (ATROVENT HFA) 17 MCG/ACT inhaler Inhale into the lungs. 11/17/19   [provider]  lidocaine (LIDODERM) 5 % Place 1 patch onto the skin daily. Remove & Discard patch within 12 hours or as directed by MD 09/14/20   Lorre Nick, MD  linaclotide [facility]) 290 MCG CAPS capsule Take 1 capsule by mouth as needed. 11/17/19   [provider]  losartan (COZAAR) 100 MG tablet Take 100 mg by mouth daily. 05/28/21   [provider]  methocarbamol (ROBAXIN-750) 750 MG tablet Take 1 tablet (750 mg total) by mouth 4 (four) times daily. 09/14/20   Lorre Nick, MD  minocycline (MINOCIN) 100 MG capsule Take 1 capsule (100 mg total) by mouth 2 (two) times daily. Take with food and as needed for flares. 02/27/22   Willeen Niece, MD  ondansetron (ZOFRAN-ODT) 4 MG disintegrating tablet Take 1 tablet (4 mg total) by mouth every 8 (eight) hours as needed for nausea or vomiting. 01/28/22   Long, Arlyss Repress, MD  spironolactone (ALDACTONE) 25 MG tablet Take 25 mg by mouth every other day. 05/28/21   [provider]  Allergies    Sulfa antibiotics    Review of Systems   Review of Systems  Physical Exam Updated Vital Signs BP 103/65   Pulse 70   Temp 99 F (37.2 C)   Resp 16   Ht 5\' 4"  (1.626 m)   Wt 90.7 kg   SpO2 99%   BMI 34.33 kg/m  Physical Exam Vitals and nursing note reviewed.  Constitutional:      General: She is not in acute distress.    Appearance: She is well-developed. She is not ill-appearing.  HENT:     Head: Normocephalic and atraumatic.     Nose: Nose normal.     Mouth/Throat:     Mouth: Mucous membranes are moist.  Eyes:     Extraocular Movements:  Extraocular movements intact.     Conjunctiva/sclera: Conjunctivae normal.     Pupils: Pupils are equal, round, and reactive to light.  Cardiovascular:     Rate and Rhythm: Normal rate and regular rhythm.     Pulses: Normal pulses.     Heart sounds: Normal heart sounds. No murmur heard. Pulmonary:     Effort: Pulmonary effort is normal. No respiratory distress.     Breath sounds: Normal breath sounds.  Abdominal:     Palpations: Abdomen is soft.     Tenderness: There is no abdominal tenderness.  Musculoskeletal:        General: No swelling.     Cervical back: Normal range of motion and neck supple.  Skin:    General: Skin is warm and dry.     Capillary Refill: Capillary refill takes less than 2 seconds.  Neurological:     General: No focal deficit present.     Mental Status: She is alert and oriented to person, place, and time.     Cranial Nerves: No cranial nerve deficit.     Sensory: No sensory deficit.     Motor: No weakness.     Coordination: Coordination normal.     Comments: 5+ out of 5 strength throughout, normal sensation, no drift, normal speech, normal gait, normal finger-to-nose finger  Psychiatric:        Mood and Affect: Mood normal.     ED Results / Procedures / Treatments   Labs (all labs ordered are listed, but only abnormal results are displayed) Labs Reviewed  CBC - Abnormal; Notable for the following components:      Result Value   RBC 6.04 (*)    Hemoglobin 15.1 (*)    HCT 46.3 (*)    MCV 76.7 (*)    MCH 25.0 (*)    All other components within normal limits  URINALYSIS, ROUTINE W REFLEX MICROSCOPIC - Abnormal; Notable for the following components:   APPearance HAZY (*)    Specific Gravity, Urine 1.037 (*)    Hgb urine dipstick TRACE (*)    Ketones, ur TRACE (*)    Protein, ur TRACE (*)    Leukocytes,Ua LARGE (*)    Bacteria, UA RARE (*)    All other components within normal limits  RESP PANEL BY RT-PCR (RSV, FLU A&B, COVID)  RVPGX2  BASIC  METABOLIC PANEL  CBG MONITORING, ED  TROPONIN I (HIGH SENSITIVITY)  TROPONIN I (HIGH SENSITIVITY)    EKG EKG Interpretation Date/Time:  Sunday April 20 2023 10:54:40 EST Ventricular Rate:  86 PR Interval:  146 QRS Duration:  82 QT Interval:  392 QTC Calculation: 469 R Axis:   82  Text Interpretation: Normal sinus rhythm  Normal ECG No previous ECGs available Confirmed by Virgina Norfolk [phone]) on 04/20/2023 4:01:34 PM  Radiology No results found.  Procedures Procedures    Medications Ordered in ED Medications - No data to display  ED Course/ Medical Decision Making/ A&P                                 Medical Decision Making Amount and/or Complexity of Data Reviewed Labs: ordered.  Risk Prescription drug management.   Mariah Price is here with dizziness painful urination.  History of asthma hypertension COPD CHF.  Lab work done prior to my evaluation.  She has had extensive workup.  I have no concern for stroke or ACS or PE or other emergent process.  Urinalysis appears consistent with infection.  She has a normal neurological exam.  She is feeling much better.  She feels less dizzy and wants to go home.  She is ambulating in the room without any issues.  Per my review and interpretation of labs she has normal troponin x 2.  No significant anemia or leukocytosis or electrolyte abnormality.  EKG shows sinus rhythm.  No ischemic changes.  I have no concern for sepsis.  Will prescribe Keflex for UTI and have her follow-up with primary care doctor.  She understands return precautions.  This chart was dictated using voice recognition software.  Despite best efforts to proofread,  errors can occur which can change the documentation meaning.         Final Clinical Impression(s) / ED Diagnoses Final diagnoses:  Acute cystitis without hematuria    Rx / DC Orders ED Discharge Orders          Ordered    cephALEXin (KEFLEX) 500 MG capsule  2 times daily        04/20/23  1559              Virgina Norfolk, DO 04/20/23 1603

## 2023-04-20 NOTE — ED Triage Notes (Signed)
In for eval of dizziness, fatigue, feeling hot and cold onset Tuesday. Denies cough, sore throat, vomiting. Reports bilateral ear itching, intermittent nausea, and sinus pressure/headache. Reports nasal congestion in the mornings.

## 2023-05-01 ENCOUNTER — Emergency Department (HOSPITAL_COMMUNITY)
Admission: EM | Admit: 2023-05-01 | Discharge: 2023-05-01 | Disposition: A | Discharge status: Home / Self Care | Payer: BLUE CROSS/BLUE SHIELD | Attending: Emergency Medicine | Admitting: Emergency Medicine

## 2023-05-01 ENCOUNTER — Encounter (HOSPITAL_COMMUNITY): Payer: Self-pay | Admitting: Emergency Medicine

## 2023-05-01 ENCOUNTER — Other Ambulatory Visit (HOSPITAL_COMMUNITY): Payer: Self-pay | Admitting: Pharmacy Technician

## 2023-05-01 ENCOUNTER — Emergency Department (HOSPITAL_COMMUNITY): Payer: BLUE CROSS/BLUE SHIELD

## 2023-05-01 ENCOUNTER — Other Ambulatory Visit (HOSPITAL_COMMUNITY): Payer: Self-pay

## 2023-05-01 DIAGNOSIS — F17200 Nicotine dependence, unspecified, uncomplicated: Secondary | ICD-10-CM | POA: Insufficient documentation

## 2023-05-01 DIAGNOSIS — J441 Chronic obstructive pulmonary disease with (acute) exacerbation: Secondary | ICD-10-CM | POA: Insufficient documentation

## 2023-05-01 DIAGNOSIS — Z7951 Long term (current) use of inhaled steroids: Secondary | ICD-10-CM | POA: Diagnosis not present

## 2023-05-01 DIAGNOSIS — Z1152 Encounter for screening for COVID-19: Secondary | ICD-10-CM | POA: Insufficient documentation

## 2023-05-01 DIAGNOSIS — Z79899 Other long term (current) drug therapy: Secondary | ICD-10-CM | POA: Diagnosis not present

## 2023-05-01 DIAGNOSIS — J45909 Unspecified asthma, uncomplicated: Secondary | ICD-10-CM | POA: Diagnosis not present

## 2023-05-01 DIAGNOSIS — R0602 Shortness of breath: Secondary | ICD-10-CM | POA: Diagnosis present

## 2023-05-01 LAB — COMPREHENSIVE METABOLIC PANEL
ALT: 12 U/L (ref 0–44)
AST: 14 U/L — ABNORMAL LOW (ref 15–41)
Albumin: 4 g/dL (ref 3.5–5.0)
Alkaline Phosphatase: 59 U/L (ref 38–126)
Anion gap: 9 (ref 5–15)
BUN: 9 mg/dL (ref 6–20)
CO2: 22 mmol/L (ref 22–32)
Calcium: 9.4 mg/dL (ref 8.9–10.3)
Chloride: 110 mmol/L (ref 98–111)
Creatinine, Ser: 0.76 mg/dL (ref 0.44–1.00)
GFR, Estimated: >60 mL/min (ref >60)
Glucose, Bld: 106 mg/dL — ABNORMAL HIGH (ref 70–99)
Potassium: 3.8 mmol/L (ref 3.5–5.1)
Sodium: 141 mmol/L (ref 135–145)
Total Bilirubin: 0.6 mg/dL (ref <1.2)
Total Protein: 7 g/dL (ref 6.5–8.1)

## 2023-05-01 LAB — CBC WITH DIFFERENTIAL/PLATELET
Abs Immature Granulocytes: 0.01 10*3/uL (ref 0.00–0.07)
Basophils Absolute: 0.1 10*3/uL (ref 0.0–0.1)
Basophils Relative: 1 %
Eosinophils Absolute: 0.1 10*3/uL (ref 0.0–0.5)
Eosinophils Relative: 3 %
HCT: 43.6 % (ref 36.0–46.0)
Hemoglobin: 13.9 g/dL (ref 12.0–15.0)
Immature Granulocytes: 0 %
Lymphocytes Relative: 42 %
Lymphs Abs: 2.3 10*3/uL (ref 0.7–4.0)
MCH: 24.8 pg — ABNORMAL LOW (ref 26.0–34.0)
MCHC: 31.9 g/dL (ref 30.0–36.0)
MCV: 77.9 fL — ABNORMAL LOW (ref 80.0–100.0)
Monocytes Absolute: 0.5 10*3/uL (ref 0.1–1.0)
Monocytes Relative: 9 %
Neutro Abs: 2.5 10*3/uL (ref 1.7–7.7)
Neutrophils Relative %: 45 %
Platelets: 244 10*3/uL (ref 150–400)
RBC: 5.6 MIL/uL — ABNORMAL HIGH (ref 3.87–5.11)
RDW: 14.4 % (ref 11.5–15.5)
WBC: 5.4 10*3/uL (ref 4.0–10.5)
nRBC: 0 % (ref 0.0–0.2)

## 2023-05-01 LAB — RESP PANEL BY RT-PCR (RSV, FLU A&B, COVID)  RVPGX2
Influenza A by PCR: NEGATIVE
Influenza B by PCR: NEGATIVE
Resp Syncytial Virus by PCR: NEGATIVE
SARS Coronavirus 2 by RT PCR: NEGATIVE

## 2023-05-01 LAB — BRAIN NATRIURETIC PEPTIDE: B Natriuretic Peptide: 99.3 pg/mL (ref 0.0–100.0)

## 2023-05-01 LAB — TROPONIN I (HIGH SENSITIVITY): Troponin I (High Sensitivity): 3 ng/L (ref <18)

## 2023-05-01 MED ORDER — MAGNESIUM SULFATE 2 GM/50ML IV SOLN: 2.0000 g | Freq: Once | INTRAVENOUS | Status: DC

## 2023-05-01 MED ORDER — IPRATROPIUM-ALBUTEROL 0.5-2.5 (3) MG/3ML IN SOLN: 3.0000 mL | Freq: Once | RESPIRATORY_TRACT | Status: DC

## 2023-05-01 MED ORDER — METHYLPREDNISOLONE SODIUM SUCC 125 MG IJ SOLR: 125.0000 mg | Freq: Once | INTRAMUSCULAR | Status: DC

## 2023-05-01 MED ORDER — IPRATROPIUM-ALBUTEROL 0.5-2.5 (3) MG/3ML IN SOLN
3.0000 mL | RESPIRATORY_TRACT | Status: AC
  Administered 2023-05-01 (×3): 3 mL via RESPIRATORY_TRACT
  Filled 2023-05-01: qty 6

## 2023-05-01 MED ORDER — PREDNISONE 20 MG PO TABS: ORAL_TABLET | ORAL | 0 refills | Status: AC

## 2023-05-01 MED ORDER — PREDNISONE 20 MG PO TABS
60.0000 mg | ORAL_TABLET | Freq: Once | ORAL | Status: AC
Start: 2023-05-01 — End: 2023-05-01
  Administered 2023-05-01: 60 mg via ORAL
  Filled 2023-05-01: qty 3

## 2023-05-01 MED ORDER — IPRATROPIUM-ALBUTEROL 0.5-2.5 (3) MG/3ML IN SOLN
3.0000 mL | RESPIRATORY_TRACT | Status: AC
  Filled 2023-05-01: qty 3

## 2023-05-01 NOTE — ED Provider Triage Note (Signed)
Emergency Medicine Provider Triage Evaluation Note  Akshitha Huppe , a 52 y.o. female  was evaluated in triage.  Pt complains of 2 days of cough, shortness of breath, wheezing.  History of COPD.  Patient does endorse fever at home of 101 this morning.  Has used rescue inhaler with no relief.  Denies chest pain, nausea, vomiting  Review of Systems  Positive:  Negative:   Physical Exam  BP (!) 140/73 (BP Location: Right Arm)   Pulse 88   Temp 98.7 F (37.1 C) (Oral)   Resp (!) 24   SpO2 97%  Gen:   Awake, no distress   Resp:  Increased effort, wheezing MSK:   Moves extremities without difficulty  Other:    Medical Decision Making  Medically screening exam initiated at 5:56 AM.  Appropriate orders placed.  Cariah Turi was informed that the remainder of the evaluation will be completed by another provider, this initial triage assessment does not replace that evaluation, and the importance of remaining in the ED until their evaluation is complete.  Patient to get next available room   Pamala Duffel 05/01/23 1610

## 2023-05-01 NOTE — Progress Notes (Signed)
Specialty Pharmacy Refill Coordination Note  Mariah Price is a 53 y.o. female contacted today regarding refills of specialty medication(s) Adalimumab   Patient requested [name] at [facility] at [employer] date: 05/05/23   Medication will be filled on 05/02/23.

## 2023-05-01 NOTE — ED Triage Notes (Signed)
PT having asthma exacerbation. PT gave herself breathing tx at home. Cough is productive. Using belly to breathe and taking breaks in between a few words as she talks.

## 2023-05-01 NOTE — ED Provider Notes (Addendum)
[place] EMERGENCY DEPARTMENT AT [facility] Provider Note   CSN: [id number] Arrival date & time: 05/01/23  4098     History  Chief Complaint  Patient presents with   Shortness of Breath    Mariah Price is a 52 y.o. female.  52 yo F with a chief complaints of cough and difficulty breathing.  Has a history of asthma and COPD and feels like the same.  Going on for about 48 hours.  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  Denies any chest pain or pressure.  No fevers.  No abdominal pain nausea vomiting or diarrhea.   Shortness of Breath      Home Medications Prior to Admission medications   Medication Sig Start Date End Date Taking? Authorizing Provider  predniSONE (DELTASONE) 20 MG tablet 2 tabs po daily x 4 days 05/01/23  Yes Melene Plan, DO  Adalimumab (HUMIRA PEN) 80 MG/0.8ML PNKT Inject 80 mg into the skin every 14 (fourteen) days. Starting on day 29. 05/16/22   Willeen Niece, MD  Adalimumab [facility] PEN-CD/UC/HS STARTER) 80 MG/0.8ML PNKT Inject 160 mg into the skin as directed. Inject 160 mg subcutaneous on day 1, then 80 mg subcutaneous on day 15. 10/16/21   Willeen Niece, MD  adalimumab (HUMIRA, 2 PEN,) 80 MG/0.8ML pen Inject 0.8 mL (80 mg total) under the skin every 14 (fourteen) days. 02/11/23     adalimumab (HUMIRA, 2 PEN,) 80 MG/0.8ML pen Inject 0.78mL (80 mg total) under the skin every 14 (fourteen) days. 02/11/23     budesonide-formoterol (SYMBICORT) 160-4.5 MCG/ACT inhaler Inhale into the lungs. 11/17/19   [provider]  buPROPion (WELLBUTRIN XL) 150 MG 24 hr tablet Take 1 tablet by mouth daily. 11/17/19   [provider]  escitalopram (LEXAPRO) 10 MG tablet Take 10 mg by mouth daily. 08/06/21   [provider]  famotidine (PEPCID) 10 MG tablet Take 1 tablet by mouth as needed. 11/17/19   [provider]  hydrochlorothiazide (HYDRODIURIL) 12.5 MG tablet Take 12.5 mg by mouth every morning. 08/06/21    [provider]  hydrOXYzine (ATARAX) 25 MG tablet Take 25 mg by mouth 4 (four) times daily. 05/28/21   [provider]  ibuprofen (ADVIL) 400 MG tablet Take 1 tablet (400 mg total) by mouth every 6 (six) hours as needed. 09/14/20   Lorre Nick, MD  ipratropium (ATROVENT HFA) 17 MCG/ACT inhaler Inhale into the lungs. 11/17/19   [provider]  lidocaine (LIDODERM) 5 % Place 1 patch onto the skin daily. Remove & Discard patch within 12 hours or as directed by MD 09/14/20   Lorre Nick, MD  linaclotide [facility]) 290 MCG CAPS capsule Take 1 capsule by mouth as needed. 11/17/19   [provider]  losartan (COZAAR) 100 MG tablet Take 100 mg by mouth daily. 05/28/21   [provider]  methocarbamol (ROBAXIN-750) 750 MG tablet Take 1 tablet (750 mg total) by mouth 4 (four) times daily. 09/14/20   Lorre Nick, MD  minocycline (MINOCIN) 100 MG capsule Take 1 capsule (100 mg total) by mouth 2 (two) times daily. Take with food and as needed for flares. 02/27/22   Willeen Niece, MD  ondansetron (ZOFRAN-ODT) 4 MG disintegrating tablet Take 1 tablet (4 mg total) by mouth every 8 (eight) hours as needed for nausea or vomiting. 01/28/22   Long, Arlyss Repress, MD  spironolactone (ALDACTONE) 25 MG tablet Take 25 mg by mouth every other day. 05/28/21  [provider]      Allergies    Sulfa antibiotics    Review of Systems   Review of Systems  Respiratory:  Positive for shortness of breath.     Physical Exam Updated Vital Signs BP (!) 140/73 (BP Location: Right Arm)   Pulse 88   Temp 98.7 F (37.1 C) (Oral)   Resp (!) 24   SpO2 97%  Physical Exam Vitals and nursing note reviewed.  Constitutional:      General: She is not in acute distress.    Appearance: She is well-developed. She is not diaphoretic.  HENT:     Head: Normocephalic and atraumatic.  Eyes:     Pupils: Pupils are equal, round, and reactive to light.  Cardiovascular:     Rate and Rhythm:  Normal rate and regular rhythm.     Heart sounds: No murmur heard.    No friction rub. No gallop.  Pulmonary:     Effort: Pulmonary effort is normal.     Breath sounds: Wheezing present. No rales.     Comments: Coarse breath sounds diffusely with prolonged expiratory effort and expiratory wheezes. Abdominal:     General: There is no distension.     Palpations: Abdomen is soft.     Tenderness: There is no abdominal tenderness.  Musculoskeletal:        General: No tenderness.     Cervical back: Normal range of motion and neck supple.  Skin:    General: Skin is warm and dry.  Neurological:     Mental Status: She is alert and oriented to person, place, and time.  Psychiatric:        Behavior: Behavior normal.     ED Results / Procedures / Treatments   Labs (all labs ordered are listed, but only abnormal results are displayed) Labs Reviewed  COMPREHENSIVE METABOLIC PANEL - Abnormal; Notable for the following components:      Result Value   Glucose, Bld 106 (*)    AST 14 (*)    All other components within normal limits  CBC WITH DIFFERENTIAL/PLATELET - Abnormal; Notable for the following components:   RBC 5.60 (*)    MCV 77.9 (*)    MCH 24.8 (*)    All other components within normal limits  RESP PANEL BY RT-PCR (RSV, FLU A&B, COVID)  RVPGX2  BRAIN NATRIURETIC PEPTIDE  TROPONIN I (HIGH SENSITIVITY)  TROPONIN I (HIGH SENSITIVITY)    EKG None  Radiology DG Chest 2 View  Result Date: 05/01/2023 CLINICAL DATA:  52 year old female with shortness of breath, asthma, productive cough. EXAM: CHEST - 2 VIEW COMPARISON:  Chest CT 03/22/2022. FINDINGS: Upright AP and lateral views 0609 hours. Lordotic positioning on the AP view. Lung volumes and mediastinal contours are within normal limits. Visualized tracheal air column is within normal limits. Lung markings appear within normal limits today, both lungs are clear. No pneumothorax or pleural effusion. No acute osseous abnormality  identified. Negative visible bowel gas. IMPRESSION: No acute cardiopulmonary abnormality. Electronically Signed   By: Odessa Fleming M.D.   On: 05/01/2023 07:21    Procedures Procedures  Discussed smoking cessation with patient and was they were offerred resources to help stop.  Total time was 5 min CPT code 16109.    Medications Ordered in ED Medications  ipratropium-albuterol (DUONEB) 0.5-2.5 (3) MG/3ML nebulizer solution 3 mL (3 mLs Nebulization Not Given 05/01/23 0853)  predniSONE (DELTASONE) tablet 60 mg (60 mg Oral Given 05/01/23 0805)  ipratropium-albuterol (DUONEB)  0.5-2.5 (3) MG/3ML nebulizer solution 3 mL (3 mLs Nebulization Given 05/01/23 0813)    ED Course/ Medical Decision Making/ A&P                                 Medical Decision Making Amount and/or Complexity of Data Reviewed Labs: ordered. Radiology: ordered.  Risk Prescription drug management.   52 yo F with a chief complaints of difficulty breathing.  Cough congestion going on for about 48 hours.  Still smokes.  Wheezing on exam.  Will give 3 DuoNebs back-to-back and steroids and reassess.  Chest x-ray independently interpreted by me without focal infiltrate or pneumothorax.  No anemia, no significant electrolyte abnormality.  Patient feeling much better after 3 DuoNebs back-to-back and steroids.  Will discharge home.  PCP follow-up.  9:11 AM:  I have discussed the diagnosis/risks/treatment options with the patient.  Evaluation and diagnostic testing in the emergency department does not suggest an emergent condition requiring admission or immediate intervention beyond what has been performed at this time.  They will follow up with PCP. We also discussed returning to the ED immediately if new or worsening sx occur. We discussed the sx which are most concerning (e.g., sudden worsening pain, fever, inability to tolerate by mouth, need to use inhaler more often than every 4 hours) that necessitate immediate return. Medications  administered to the patient during their visit and any new prescriptions provided to the patient are listed below.  Medications given during this visit Medications  ipratropium-albuterol (DUONEB) 0.5-2.5 (3) MG/3ML nebulizer solution 3 mL (3 mLs Nebulization Not Given 05/01/23 0853)  predniSONE (DELTASONE) tablet 60 mg (60 mg Oral Given 05/01/23 0805)  ipratropium-albuterol (DUONEB) 0.5-2.5 (3) MG/3ML nebulizer solution 3 mL (3 mLs Nebulization Given 05/01/23 0813)     The patient appears reasonably screen and/or stabilized for discharge and I doubt any other medical condition or other [facility] requiring further screening, evaluation, or treatment in the ED at this time prior to discharge.          Final Clinical Impression(s) / ED Diagnoses Final diagnoses:  COPD exacerbation (HCC)    Rx / DC Orders ED Discharge Orders          Ordered    predniSONE (DELTASONE) 20 MG tablet        05/01/23 0907              Melene Plan, DO 05/01/23 0911    Melene Plan, DO 05/01/23 [phone]

## 2023-05-01 NOTE — Discharge Instructions (Addendum)
Take the steroids as prescribed.  Please follow-up with your family doctor in the office. Max inhaler dosing below Use your inhaler every 4 hours(6 puffs) while awake, return for sudden worsening shortness of breath, or if you need to use your inhaler more often.   Smoking is bad for you.  There is help if you want it in the paperwork.  Call the hotline and someone can help you arrange for help.

## 2023-05-02 ENCOUNTER — Other Ambulatory Visit: Payer: Self-pay

## 2023-05-02 ENCOUNTER — Other Ambulatory Visit (HOSPITAL_COMMUNITY): Payer: Self-pay

## 2023-05-27 ENCOUNTER — Other Ambulatory Visit: Payer: Self-pay

## 2023-05-27 NOTE — Progress Notes (Signed)
 Specialty Pharmacy Refill Coordination Note  Mariah Price is a 52 y.o. female contacted today regarding refills of specialty medication(s) Adalimumab  (Humira  (2 Pen))   Patient requested Delivery   Delivery date: 06/03/23   Verified address: [address]   Medication will be filled on 06/02/23.

## 2023-05-29 ENCOUNTER — Other Ambulatory Visit: Payer: Self-pay

## 2023-05-29 ENCOUNTER — Other Ambulatory Visit (HOSPITAL_COMMUNITY): Payer: Self-pay

## 2023-06-02 ENCOUNTER — Other Ambulatory Visit: Payer: Self-pay

## 2023-06-02 NOTE — Progress Notes (Signed)
 Pharmacy Patient Advocate Encounter   Received notification from Patient Pharmacy that prior authorization for Humira  is required/requested.   Insurance verification completed.   The patient is insured through [facility] .   Per test claim: PA required; PA submitted to above mentioned insurance via CoverMyMeds Key/confirmation #/EOC [facility] Status is pending

## 2023-06-03 ENCOUNTER — Other Ambulatory Visit: Payer: Self-pay

## 2023-06-04 ENCOUNTER — Other Ambulatory Visit (HOSPITAL_COMMUNITY): Payer: Self-pay

## 2023-06-04 ENCOUNTER — Other Ambulatory Visit: Payer: Self-pay

## 2023-06-04 NOTE — Progress Notes (Signed)
 Patient's copay is $1,300. Medication is tier 4 with her insurance plan. She has an out of pocket max of $1,300. Once she pays that amount-copay will go down to $0.

## 2023-06-04 NOTE — Progress Notes (Signed)
 Pharmacy Patient Advocate Encounter  Received notification from [facility] that Prior Authorization for Humira has been APPROVED from 06/03/23 to 06/02/24   PA #/Case ID/Reference #:  [id number]

## 2023-06-05 ENCOUNTER — Other Ambulatory Visit (HOSPITAL_COMMUNITY): Payer: Self-pay

## 2023-06-05 ENCOUNTER — Other Ambulatory Visit: Payer: Self-pay

## 2023-06-09 ENCOUNTER — Other Ambulatory Visit: Payer: Self-pay

## 2023-06-10 ENCOUNTER — Other Ambulatory Visit: Payer: Self-pay

## 2023-06-17 ENCOUNTER — Other Ambulatory Visit (HOSPITAL_BASED_OUTPATIENT_CLINIC_OR_DEPARTMENT_OTHER): Payer: Self-pay

## 2023-06-17 ENCOUNTER — Other Ambulatory Visit: Payer: Self-pay

## 2023-06-19 ENCOUNTER — Other Ambulatory Visit: Payer: Self-pay

## 2023-06-19 ENCOUNTER — Other Ambulatory Visit (HOSPITAL_COMMUNITY): Payer: Self-pay

## 2023-06-23 ENCOUNTER — Other Ambulatory Visit: Payer: Self-pay

## 2023-06-25 ENCOUNTER — Other Ambulatory Visit: Payer: Self-pay

## 2023-07-01 ENCOUNTER — Other Ambulatory Visit: Payer: Self-pay

## 2023-07-01 NOTE — Progress Notes (Signed)
Patient now has [employer]. PA required

## 2023-07-01 NOTE — Progress Notes (Signed)
 Pharmacy Patient Advocate Encounter   Received notification from Patient Pharmacy that prior authorization for Humira  is required/requested.   Insurance verification completed.   The patient is insured through [facility] .   Per test claim: PA required; PA submitted to above mentioned insurance via CoverMyMeds Key/confirmation #/EOC [id number] Status is pending

## 2023-07-02 ENCOUNTER — Other Ambulatory Visit: Payer: Self-pay

## 2023-07-02 NOTE — Progress Notes (Signed)
 Pharmacy Patient Advocate Encounter  Received notification from [facility] that Prior Authorization for Humira  has been APPROVED from 07/01/23 to 07/01/24   PA #/Case ID/Reference #: [id number]  Patient must fill with [facility] pharmacy

## 2023-07-02 NOTE — Progress Notes (Signed)
 Patient's new insurance requires her to fill with [facility] pharmacy. Provider will send new prescription there.   ATC patient- no vm available for cell or home number. Dis-enrolling.

## 2023-10-13 ENCOUNTER — Other Ambulatory Visit: Payer: Self-pay

## 2023-10-13 ENCOUNTER — Encounter (HOSPITAL_COMMUNITY): Payer: Self-pay

## 2023-10-13 ENCOUNTER — Emergency Department (HOSPITAL_COMMUNITY)
Admission: EM | Admit: 2023-10-13 | Discharge: 2023-10-13 | Disposition: A | Discharge status: Home / Self Care | Attending: Emergency Medicine | Admitting: Emergency Medicine

## 2023-10-13 DIAGNOSIS — Z79899 Other long term (current) drug therapy: Secondary | ICD-10-CM | POA: Diagnosis not present

## 2023-10-13 DIAGNOSIS — Z7951 Long term (current) use of inhaled steroids: Secondary | ICD-10-CM | POA: Diagnosis not present

## 2023-10-13 DIAGNOSIS — I509 Heart failure, unspecified: Secondary | ICD-10-CM | POA: Diagnosis not present

## 2023-10-13 DIAGNOSIS — Z7952 Long term (current) use of systemic steroids: Secondary | ICD-10-CM | POA: Insufficient documentation

## 2023-10-13 DIAGNOSIS — R519 Headache, unspecified: Secondary | ICD-10-CM | POA: Diagnosis present

## 2023-10-13 DIAGNOSIS — J4489 Other specified chronic obstructive pulmonary disease: Secondary | ICD-10-CM | POA: Insufficient documentation

## 2023-10-13 DIAGNOSIS — I110 Hypertensive heart disease with heart failure: Secondary | ICD-10-CM | POA: Insufficient documentation

## 2023-10-13 DIAGNOSIS — G43909 Migraine, unspecified, not intractable, without status migrainosus: Secondary | ICD-10-CM | POA: Diagnosis not present

## 2023-10-13 LAB — I-STAT CHEM 8, ED
BUN: 12 mg/dL (ref 6–20)
Calcium, Ion: 1.12 mmol/L — ABNORMAL LOW (ref 1.15–1.40)
Chloride: 107 mmol/L (ref 98–111)
Creatinine, Ser: 0.8 mg/dL (ref 0.44–1.00)
Glucose, Bld: 101 mg/dL — ABNORMAL HIGH (ref 70–99)
HCT: 45 % (ref 36.0–46.0)
Hemoglobin: 15.3 g/dL — ABNORMAL HIGH (ref 12.0–15.0)
Potassium: 4.2 mmol/L (ref 3.5–5.1)
Sodium: 140 mmol/L (ref 135–145)
TCO2: 22 mmol/L (ref 22–32)

## 2023-10-13 MED ORDER — SODIUM CHLORIDE 0.9 % IV BOLUS: 500.0000 mL | Freq: Once | INTRAVENOUS | Status: DC

## 2023-10-13 MED ORDER — MECLIZINE HCL 12.5 MG PO TABS
25.0000 mg | ORAL_TABLET | Freq: Once | ORAL | Status: AC
  Administered 2023-10-13: 25 mg via ORAL
  Filled 2023-10-13: qty 2

## 2023-10-13 MED ORDER — ONDANSETRON HCL 4 MG/2ML IJ SOLN
4.0000 mg | Freq: Once | INTRAMUSCULAR | Status: AC
Start: 2023-10-13 — End: 2023-10-13
  Administered 2023-10-13: 4 mg via INTRAVENOUS
  Filled 2023-10-13: qty 2

## 2023-10-13 MED ORDER — MECLIZINE HCL 25 MG PO TABS: 25.0000 mg | ORAL_TABLET | Freq: Three times a day (TID) | ORAL | 0 refills | Status: AC | PRN

## 2023-10-13 MED ORDER — ONDANSETRON HCL 4 MG PO TABS: 4.0000 mg | ORAL_TABLET | Freq: Three times a day (TID) | ORAL | 0 refills | Status: AC | PRN

## 2023-10-13 MED ORDER — KETOROLAC TROMETHAMINE 30 MG/ML IJ SOLN
15.0000 mg | Freq: Once | INTRAMUSCULAR | Status: AC
  Administered 2023-10-13: 15 mg via INTRAVENOUS
  Filled 2023-10-13: qty 1

## 2023-10-13 NOTE — ED Triage Notes (Signed)
 Patient BIB [facility], picked up off [address] from vehicle. Woke up to go to to work with a headache, nauseated, and vomited 6 times en route to work.
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
## 2023-10-13 NOTE — ED Notes (Signed)
 Pt lying in bed with family at bedside. A&Ox4. Breathing WNL. Denies headache, N/V, or dizziness at this time. Pt stated, "I feel a lot better. I am just ready to go."

## 2023-10-13 NOTE — Discharge Instructions (Signed)
 Your history and exam today and your response to the medications given are very reassuring that this is a migraine headache but not resolved.  I am prescribing you additional Zofran  and meclizine  which you may take for return of nausea or dizziness as discussed.  Plan to keep your appointment with your neurologist next week in follow-up.

## 2024-03-14 ENCOUNTER — Encounter (HOSPITAL_BASED_OUTPATIENT_CLINIC_OR_DEPARTMENT_OTHER): Payer: Self-pay

## 2024-03-14 ENCOUNTER — Emergency Department (HOSPITAL_BASED_OUTPATIENT_CLINIC_OR_DEPARTMENT_OTHER): Admitting: Radiology

## 2024-03-14 ENCOUNTER — Emergency Department (HOSPITAL_BASED_OUTPATIENT_CLINIC_OR_DEPARTMENT_OTHER)
Admission: EM | Admit: 2024-03-14 | Discharge: 2024-03-14 | Disposition: A | Discharge status: Home / Self Care | Attending: Emergency Medicine | Admitting: Emergency Medicine

## 2024-03-14 DIAGNOSIS — M542 Cervicalgia: Secondary | ICD-10-CM | POA: Diagnosis not present

## 2024-03-14 DIAGNOSIS — R42 Dizziness and giddiness: Secondary | ICD-10-CM | POA: Diagnosis not present

## 2024-03-14 DIAGNOSIS — I110 Hypertensive heart disease with heart failure: Secondary | ICD-10-CM | POA: Diagnosis not present

## 2024-03-14 DIAGNOSIS — R61 Generalized hyperhidrosis: Secondary | ICD-10-CM | POA: Diagnosis not present

## 2024-03-14 DIAGNOSIS — K5900 Constipation, unspecified: Secondary | ICD-10-CM | POA: Diagnosis not present

## 2024-03-14 DIAGNOSIS — I509 Heart failure, unspecified: Secondary | ICD-10-CM | POA: Insufficient documentation

## 2024-03-14 DIAGNOSIS — Z7951 Long term (current) use of inhaled steroids: Secondary | ICD-10-CM | POA: Insufficient documentation

## 2024-03-14 DIAGNOSIS — Z79899 Other long term (current) drug therapy: Secondary | ICD-10-CM | POA: Diagnosis not present

## 2024-03-14 DIAGNOSIS — M25512 Pain in left shoulder: Secondary | ICD-10-CM | POA: Diagnosis not present

## 2024-03-14 DIAGNOSIS — F17210 Nicotine dependence, cigarettes, uncomplicated: Secondary | ICD-10-CM | POA: Insufficient documentation

## 2024-03-14 DIAGNOSIS — J449 Chronic obstructive pulmonary disease, unspecified: Secondary | ICD-10-CM | POA: Diagnosis not present

## 2024-03-14 DIAGNOSIS — R079 Chest pain, unspecified: Secondary | ICD-10-CM | POA: Diagnosis present

## 2024-03-14 LAB — COMPREHENSIVE METABOLIC PANEL WITH GFR
ALT: 8 U/L (ref 0–44)
AST: 16 U/L (ref 15–41)
Albumin: 4.1 g/dL (ref 3.5–5.0)
Alkaline Phosphatase: 80 U/L (ref 38–126)
Anion gap: 10 (ref 5–15)
BUN: 9 mg/dL (ref 6–20)
CO2: 26 mmol/L (ref 22–32)
Calcium: 9.8 mg/dL (ref 8.9–10.3)
Chloride: 102 mmol/L (ref 98–111)
Creatinine, Ser: 0.8 mg/dL (ref 0.44–1.00)
GFR, Estimated: >60 mL/min (ref >60)
Glucose, Bld: 120 mg/dL — ABNORMAL HIGH (ref 70–99)
Potassium: 4.1 mmol/L (ref 3.5–5.1)
Sodium: 139 mmol/L (ref 135–145)
Total Bilirubin: 0.6 mg/dL (ref 0.0–1.2)
Total Protein: 7.2 g/dL (ref 6.5–8.1)

## 2024-03-14 LAB — CBC WITH DIFFERENTIAL/PLATELET
Abs Immature Granulocytes: 0.02 K/uL (ref 0.00–0.07)
Basophils Absolute: 0 K/uL (ref 0.0–0.1)
Basophils Relative: 1 %
Eosinophils Absolute: 0.1 K/uL (ref 0.0–0.5)
Eosinophils Relative: 1 %
HCT: 44.9 % (ref 36.0–46.0)
Hemoglobin: 14.4 g/dL (ref 12.0–15.0)
Immature Granulocytes: 0 %
Lymphocytes Relative: 39 %
Lymphs Abs: 2.7 K/uL (ref 0.7–4.0)
MCH: 25.2 pg — ABNORMAL LOW (ref 26.0–34.0)
MCHC: 32.1 g/dL (ref 30.0–36.0)
MCV: 78.5 fL — ABNORMAL LOW (ref 80.0–100.0)
Monocytes Absolute: 0.4 K/uL (ref 0.1–1.0)
Monocytes Relative: 6 %
Neutro Abs: 3.6 K/uL (ref 1.7–7.7)
Neutrophils Relative %: 53 %
Platelets: 218 K/uL (ref 150–400)
RBC: 5.72 MIL/uL — ABNORMAL HIGH (ref 3.87–5.11)
RDW: 13.4 % (ref 11.5–15.5)
WBC: 6.8 K/uL (ref 4.0–10.5)
nRBC: 0 % (ref 0.0–0.2)

## 2024-03-14 LAB — TROPONIN T, HIGH SENSITIVITY: Troponin T High Sensitivity: <15 ng/L (ref 0–19)

## 2024-03-14 LAB — LIPASE, BLOOD: Lipase: 48 U/L (ref 11–51)

## 2024-03-14 MED ORDER — POLYETHYLENE GLYCOL 3350 17 G PO PACK: 17.0000 g | PACK | Freq: Every day | ORAL | 0 refills | Status: AC | PRN

## 2024-03-14 MED ORDER — KETOROLAC TROMETHAMINE 30 MG/ML IJ SOLN
30.0000 mg | Freq: Once | INTRAMUSCULAR | Status: AC
  Administered 2024-03-14: 30 mg via INTRAVENOUS
  Filled 2024-03-14: qty 1

## 2024-03-14 MED ORDER — CYCLOBENZAPRINE HCL 10 MG PO TABS: 10.0000 mg | ORAL_TABLET | Freq: Two times a day (BID) | ORAL | 0 refills | Status: AC | PRN

## 2024-03-14 MED ORDER — LIDOCAINE 5 % EX PTCH: 1.0000 | MEDICATED_PATCH | CUTANEOUS | 0 refills | Status: AC

## 2024-03-14 MED ORDER — ONDANSETRON HCL 4 MG/2ML IJ SOLN
4.0000 mg | Freq: Once | INTRAMUSCULAR | Status: AC
  Administered 2024-03-14: 4 mg via INTRAVENOUS
  Filled 2024-03-14: qty 2

## 2024-03-14 MED ORDER — SENNOSIDES-DOCUSATE SODIUM 8.6-50 MG PO TABS: 1.0000 | ORAL_TABLET | Freq: Every evening | ORAL | 0 refills | Status: AC | PRN

## 2024-03-14 NOTE — ED Triage Notes (Signed)
 She c/o 4 day hx of left arm and neck pain radiating into upper left chest. It is much worse with movement. She denies fever, nor any other sign of current illness.

## 2024-03-14 NOTE — ED Notes (Signed)

## 2024-03-14 NOTE — ED Provider Notes (Signed)
 [place] EMERGENCY DEPARTMENT AT [facility] Provider Note   CSN: [id number] Arrival date & time: 03/14/24  0700     Patient presents with: Arm Pain   Mariah Price is a 53 y.o. female.  {Add pertinent medical, surgical, social history, OB history to HPI:32986} HPI    53 year old female with a history of asthma/COPD, CHF, hypertension, pancreatitis, HS, who presents with concern for chest pain and shoulder pain.  Reports the chest pain and shoulder pain have been going on for 4 days.  The pain in the shoulder is worse with arm movements.  Denies any trauma or increased activity.  She cannot say if anything specifically makes the chest pain better or worse.  It is not necessarily exertional, pleuritic or positional.  She denies significant shortness of breath with it.  She has had some nausea, diaphoresis, and lightheadedness.  She has not had leg pain or swelling.  She has been constipated, with her last bowel movement 1 week ago.  She has abdominal discomfort 3 out of 10 that she gets when she is constipated.  She has had continued nausea but not vomiting.  No fever or cough.  Denies numbness or weakness.  She has had some neck pain.  Reports significant family history of early heart disease, with her sister dying of an MI at the age of 38, nephew who had LVAD and passed and nephew son (13yo) who also passed. She does smoke cigarettes, denies etoh or drug use.   Past Medical History:  Diagnosis Date   Arthritis    Asthma    CHF (congestive heart failure) (HCC)    COPD (chronic obstructive pulmonary disease) (HCC)    Diverticulitis    Hypertension    Pancreatitis      Prior to Admission medications   Medication Sig Start Date End Date Taking? Authorizing Provider  Adalimumab  (HUMIRA  PEN) 80 MG/0.8ML PNKT Inject 80 mg into the skin every 14 (fourteen) days. Starting on day 29. 05/16/22   Jackquline Sawyer, MD  Adalimumab  (HUMIRA  PEN-CD/UC/HS STARTER) 80 MG/0.8ML PNKT Inject  160 mg into the skin as directed. Inject 160 mg subcutaneous on day 1, then 80 mg subcutaneous on day 15. 10/16/21   Jackquline Sawyer, MD  adalimumab  (HUMIRA , 2 PEN,) 80 MG/0.8ML pen Inject 0.8 mL (80 mg total) under the skin every 14 (fourteen) days. 02/11/23     adalimumab  (HUMIRA , 2 PEN,) 80 MG/0.8ML pen Inject 0.8mL (80 mg total) under the skin every 14 (fourteen) days. 02/11/23     budesonide-formoterol (SYMBICORT) 160-4.5 MCG/ACT inhaler Inhale into the lungs. 11/17/19   [provider]  buPROPion (WELLBUTRIN XL) 150 MG 24 hr tablet Take 1 tablet by mouth daily. 11/17/19   [provider]  escitalopram (LEXAPRO) 10 MG tablet Take 10 mg by mouth daily. 08/06/21   [provider]  famotidine (PEPCID) 10 MG tablet Take 1 tablet by mouth as needed. 11/17/19   [provider]  hydrochlorothiazide (HYDRODIURIL) 12.5 MG tablet Take 12.5 mg by mouth every morning. 08/06/21   [provider]  hydrOXYzine (ATARAX) 25 MG tablet Take 25 mg by mouth 4 (four) times daily. 05/28/21   [provider]  ibuprofen  (ADVIL ) 400 MG tablet Take 1 tablet (400 mg total) by mouth every 6 (six) hours as needed. 09/14/20   Dasie Faden, MD  ipratropium (ATROVENT HFA) 17 MCG/ACT inhaler Inhale into the lungs. 11/17/19   [provider]  lidocaine  (LIDODERM ) 5 % Place 1 patch onto the skin  daily. Remove & Discard patch within 12 hours or as directed by MD 09/14/20   Dasie Faden, MD  linaclotide [facility]) 290 MCG CAPS capsule Take 1 capsule by mouth as needed. 11/17/19   [provider]  losartan  (COZAAR ) 100 MG tablet Take 100 mg by mouth daily. 05/28/21   [provider]  meclizine  (ANTIVERT ) 25 MG tablet Take 1 tablet (25 mg total) by mouth 3 (three) times daily as needed for dizziness. 10/13/23   Idol, Julie, PA-C  methocarbamol  (ROBAXIN -750) 750 MG tablet Take 1 tablet (750 mg total) by mouth 4 (four) times daily. 09/14/20   Dasie Faden, MD  minocycline   (MINOCIN ) 100 MG capsule Take 1 capsule (100 mg total) by mouth 2 (two) times daily. Take with food and as needed for flares. 02/27/22   Jackquline Sawyer, MD  ondansetron  (ZOFRAN ) 4 MG tablet Take 1 tablet (4 mg total) by mouth every 8 (eight) hours as needed for nausea or vomiting. 10/13/23   Idol, Mliss, PA-C  ondansetron  (ZOFRAN -ODT) 4 MG disintegrating tablet Take 1 tablet (4 mg total) by mouth every 8 (eight) hours as needed for nausea or vomiting. 01/28/22   LongFonda MATSU, MD  predniSONE  (DELTASONE ) 20 MG tablet 2 tabs po daily x 4 days 05/01/23   Floyd, Dan, DO  spironolactone (ALDACTONE) 25 MG tablet Take 25 mg by mouth every other day. 05/28/21   [provider]    Allergies: Sulfa antibiotics    Review of Systems  Updated Vital Signs BP (!) 162/94 (BP Location: Right Arm)   Pulse 70   Temp 99 F (37.2 C) (Oral)   Resp 14   SpO2 100%   Physical Exam Vitals and nursing note reviewed.  Constitutional:      General: She is not in acute distress.    Appearance: She is well-developed. She is not diaphoretic.  HENT:     Head: Normocephalic and atraumatic.  Eyes:     Conjunctiva/sclera: Conjunctivae normal.  Cardiovascular:     Rate and Rhythm: Normal rate and regular rhythm.     Heart sounds: Normal heart sounds. No murmur heard.    No friction rub. No gallop.  Pulmonary:     Effort: Pulmonary effort is normal. No respiratory distress.     Breath sounds: Normal breath sounds. No wheezing or rales.  Abdominal:     General: There is no distension.     Palpations: Abdomen is soft.     Tenderness: There is no abdominal tenderness. There is no guarding.  Musculoskeletal:        General: Tenderness (left shoulder, left trapezius) present.     Cervical back: Normal range of motion.     Comments: Normal strength bilateral UE, does have pain with shoulder flexion, abduction   Skin:    General: Skin is warm and dry.     Findings: No erythema or rash.  Neurological:     Mental  Status: She is alert and oriented to person, place, and time.     (all labs ordered are listed, but only abnormal results are displayed) Labs Reviewed  CBC WITH DIFFERENTIAL/PLATELET  COMPREHENSIVE METABOLIC PANEL WITH GFR  LIPASE, BLOOD  TROPONIN T, HIGH SENSITIVITY    EKG: None  Radiology: No results found.  {Document cardiac monitor, telemetry assessment procedure when appropriate:32947} Procedures   Medications Ordered in the ED  ondansetron  (ZOFRAN ) injection 4 mg (has no administration in time range)  ketorolac  (TORADOL ) 30 MG/ML injection 30 mg (has no administration  in time range)      {Click here for ABCD2, HEART and other calculators REFRESH Note before signing:1}                               53 year old female with a history of asthma/COPD, CHF, hypertension, pancreatitis, HS, who presents with concern for chest pain and shoulder pain.  {Document critical care time when appropriate  Document review of labs and clinical decision tools ie CHADS2VASC2, etc  Document your independent review of radiology images and any outside records  Document your discussion with family members, caretakers and with consultants  Document social determinants of health affecting pt's care  Document your decision making why or why not admission, treatments were needed:32947:::1}   Final diagnoses:  None    ED Discharge Orders     None

## 2024-03-14 NOTE — ED Notes (Signed)
 Patient transported to X-ray

## 2024-05-18 ENCOUNTER — Other Ambulatory Visit: Payer: Self-pay
